# Patient Record
Sex: Female | Born: 1980
Health system: Southern US, Community
[De-identification: ages and names within clinical notes are randomized; demographics above are authoritative.]

## PROBLEM LIST (undated history)

## (undated) DIAGNOSIS — A599 Trichomoniasis, unspecified: Secondary | ICD-10-CM

## (undated) DIAGNOSIS — R0600 Dyspnea, unspecified: Secondary | ICD-10-CM

## (undated) DIAGNOSIS — J40 Bronchitis, not specified as acute or chronic: Secondary | ICD-10-CM

## (undated) DIAGNOSIS — G4700 Insomnia, unspecified: Secondary | ICD-10-CM

## (undated) DIAGNOSIS — F419 Anxiety disorder, unspecified: Secondary | ICD-10-CM

## (undated) DIAGNOSIS — J45909 Unspecified asthma, uncomplicated: Secondary | ICD-10-CM

## (undated) DIAGNOSIS — K219 Gastro-esophageal reflux disease without esophagitis: Secondary | ICD-10-CM

## (undated) HISTORY — DX: Insomnia, unspecified: G47.00

## (undated) HISTORY — PX: OTHER SURGICAL HISTORY: SHX169

## (undated) HISTORY — PX: ABDOMINAL HYSTERECTOMY: SHX81

## (undated) HISTORY — DX: Trichomoniasis, unspecified: A59.9

## (undated) HISTORY — PX: TUBAL LIGATION: SHX77

---

## 2002-04-04 ENCOUNTER — Emergency Department (HOSPITAL_COMMUNITY): Admission: EM | Admit: 2002-04-04 | Discharge: 2002-04-04 | Payer: Self-pay | Admitting: Emergency Medicine

## 2002-07-10 ENCOUNTER — Inpatient Hospital Stay (HOSPITAL_COMMUNITY): Admission: AD | Admit: 2002-07-10 | Discharge: 2002-07-12 | Payer: Self-pay | Admitting: Obstetrics and Gynecology

## 2002-08-10 ENCOUNTER — Ambulatory Visit (HOSPITAL_COMMUNITY): Admission: RE | Admit: 2002-08-10 | Discharge: 2002-08-10 | Payer: Self-pay | Admitting: Obstetrics and Gynecology

## 2003-08-21 ENCOUNTER — Emergency Department (HOSPITAL_COMMUNITY): Admission: EM | Admit: 2003-08-21 | Discharge: 2003-08-21 | Payer: Self-pay | Admitting: Emergency Medicine

## 2003-08-27 ENCOUNTER — Encounter (HOSPITAL_COMMUNITY): Admission: RE | Admit: 2003-08-27 | Discharge: 2003-09-26 | Payer: Self-pay | Admitting: Preventative Medicine

## 2004-06-18 ENCOUNTER — Emergency Department (HOSPITAL_COMMUNITY): Admission: EM | Admit: 2004-06-18 | Discharge: 2004-06-18 | Payer: Self-pay | Admitting: Emergency Medicine

## 2004-12-25 ENCOUNTER — Emergency Department (HOSPITAL_COMMUNITY): Admission: EM | Admit: 2004-12-25 | Discharge: 2004-12-25 | Payer: Self-pay | Admitting: Emergency Medicine

## 2006-07-04 ENCOUNTER — Emergency Department (HOSPITAL_COMMUNITY): Admission: EM | Admit: 2006-07-04 | Discharge: 2006-07-04 | Payer: Self-pay | Admitting: Emergency Medicine

## 2006-11-03 ENCOUNTER — Emergency Department (HOSPITAL_COMMUNITY): Admission: EM | Admit: 2006-11-03 | Discharge: 2006-11-03 | Payer: Self-pay | Admitting: Emergency Medicine

## 2007-02-05 ENCOUNTER — Emergency Department (HOSPITAL_COMMUNITY): Admission: EM | Admit: 2007-02-05 | Discharge: 2007-02-05 | Payer: Self-pay | Admitting: *Deleted

## 2007-03-03 ENCOUNTER — Ambulatory Visit (HOSPITAL_COMMUNITY): Admission: RE | Admit: 2007-03-03 | Discharge: 2007-03-03 | Payer: Self-pay | Admitting: Unknown Physician Specialty

## 2007-09-07 ENCOUNTER — Emergency Department (HOSPITAL_COMMUNITY): Admission: EM | Admit: 2007-09-07 | Discharge: 2007-09-07 | Payer: Self-pay | Admitting: Emergency Medicine

## 2007-10-12 ENCOUNTER — Ambulatory Visit: Payer: Self-pay | Admitting: Internal Medicine

## 2007-10-17 ENCOUNTER — Ambulatory Visit (HOSPITAL_COMMUNITY): Admission: RE | Admit: 2007-10-17 | Discharge: 2007-10-17 | Payer: Self-pay | Admitting: Internal Medicine

## 2007-10-17 ENCOUNTER — Encounter: Payer: Self-pay | Admitting: Internal Medicine

## 2007-10-17 ENCOUNTER — Ambulatory Visit: Payer: Self-pay | Admitting: Internal Medicine

## 2009-10-20 ENCOUNTER — Emergency Department (HOSPITAL_COMMUNITY): Admission: EM | Admit: 2009-10-20 | Discharge: 2009-10-20 | Payer: Self-pay | Admitting: Emergency Medicine

## 2010-10-26 LAB — CBC
HCT: 38.5 % (ref 36.0–46.0)
Hemoglobin: 13 g/dL (ref 12.0–15.0)
MCHC: 33.8 g/dL (ref 30.0–36.0)
MCV: 81.7 fL (ref 78.0–100.0)
Platelets: 281 10*3/uL (ref 150–400)
RBC: 4.71 MIL/uL (ref 3.87–5.11)
RDW: 13.8 % (ref 11.5–15.5)
WBC: 8.1 10*3/uL (ref 4.0–10.5)

## 2010-10-26 LAB — BASIC METABOLIC PANEL
BUN: 15 mg/dL (ref 6–23)
CO2: 24 mEq/L (ref 19–32)
Calcium: 8.9 mg/dL (ref 8.4–10.5)
Chloride: 107 mEq/L (ref 96–112)
Creatinine, Ser: 0.71 mg/dL (ref 0.4–1.2)
GFR calc Af Amer: >60 mL/min (ref >60)
GFR calc non Af Amer: >60 mL/min (ref >60)
Glucose, Bld: 101 mg/dL — ABNORMAL HIGH (ref 70–99)
Potassium: 4.1 mEq/L (ref 3.5–5.1)
Sodium: 138 mEq/L (ref 135–145)

## 2010-10-26 LAB — DIFFERENTIAL
Basophils Absolute: 0 10*3/uL (ref 0.0–0.1)
Basophils Relative: 0 % (ref 0–1)
Eosinophils Absolute: 0.4 10*3/uL (ref 0.0–0.7)
Eosinophils Relative: 5 % (ref 0–5)
Lymphocytes Relative: 29 % (ref 12–46)
Lymphs Abs: 2.4 10*3/uL (ref 0.7–4.0)
Monocytes Absolute: 0.8 10*3/uL (ref 0.1–1.0)
Monocytes Relative: 10 % (ref 3–12)
Neutro Abs: 4.5 10*3/uL (ref 1.7–7.7)
Neutrophils Relative %: 56 % (ref 43–77)

## 2010-12-09 ENCOUNTER — Emergency Department (HOSPITAL_COMMUNITY)
Admission: EM | Admit: 2010-12-09 | Discharge: 2010-12-10 | Disposition: A | Discharge status: Home / Self Care | Payer: 59 | Attending: Emergency Medicine | Admitting: Emergency Medicine

## 2010-12-09 DIAGNOSIS — R079 Chest pain, unspecified: Secondary | ICD-10-CM | POA: Insufficient documentation

## 2010-12-09 DIAGNOSIS — K219 Gastro-esophageal reflux disease without esophagitis: Secondary | ICD-10-CM | POA: Insufficient documentation

## 2010-12-16 NOTE — Consult Note (Signed)
NAME:  Rachel Jefferson, Rachel Jefferson          ACCOUNT NO.:  [id number]   MEDICAL RECORD NO.:  [id number]          PATIENT TYPE:  AMB   LOCATION:  DAY                           FACILITY:  [facility]   PHYSICIAN:  R. Roetta Sessions, M.D. DATE OF BIRTH:  Apr 24, 1981   DATE OF CONSULTATION:  DATE OF DISCHARGE:                                 CONSULTATION   REASON FOR CONSULTATION:  Rectal bleeding.   HISTORY OF PRESENT ILLNESS:  Rachel Jefferson is a very pleasant 30-  year-old [place] American female sent over through the courtesy of Dr.  Patrica Duel to further evaluate intermittent blood per rectum and  hemoccult positive stool.  Rachel Jefferson relates a couple year history  of intermittent gross blood per rectum in association with a bowel  movement.  She has a bowel movement on the order of every other day,  occasionally sees blood mixed with the stool and when she wipes. She has  not had any diarrhea.  She is occasionally constipated but goes no less  frequently than every other day.  She has some left lower quadrant  abdominal pain in association with occasional constipation.  When she is  not constipated, she has no abdominal pain.  She does have occasional  gastroesophageal reflux disease symptoms and takes Tums. No odynophagia  or dysphagia, early satiety, hematemesis.  She weighed 195 pounds back  in December 2000, has lost down to 187 as part of a desire to become  more healthy.  She does not use alcohol or tobacco.  She does take  Motrin p.r.n. for occasional headaches. There is no family history of  inflammatory bowel disease or colorectal neoplasia.   PAST MEDICAL HISTORY:  Significant for migraine headaches.   PAST SURGICAL HISTORY:  Tubal ligation.   CURRENT MEDICATIONS:  Fioricet 1 q.6h. p.r.n. headache and Motrin p.r.n.  headache.   ALLERGIES:  PENICILLIN.   FAMILY HISTORY:  Both mother and father are alive in good health,  although the father had an MI.  No history of chronic  GI or liver  illness, otherwise.   SOCIAL HISTORY:  The patient is single.  She has three children.  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  No tobacco, no alcohol, no illicit  drugs.   REVIEW OF SYSTEMS:  No chest pain.  No dyspnea on exertion.  No fever or  chills. Otherwise, as in history of present illness.   PHYSICAL EXAMINATION:  GENERAL:  A pleasant 30 year old lady resting  comfortably.  VITAL SIGNS:  Weight 187, height 5 feet 4 inches, temperature 98.5,  blood pressure 110/74, pulse 84.  SKIN:  Warm and dry.  HEENT:  No scleral icterus.  Conjunctivae are pink.  CHEST:  Lungs are clear to auscultation.  HEART:  Regular rate and rhythm without murmur, gallop, or rub.  BREASTS:  Exam is deferred.  ABDOMEN:  Obese, positive bowel sounds, soft, nontender without  reasonable mass or organomegaly.  EXTREMITIES:  No edema.  RECTUM:  Exam deferred until the time of colonoscopy.   IMPRESSION:  Rachel Jefferson is a 30 year old lady with chronic  intermittent hematochezia.  She is occasionally constipated.  She is  hemoccult positive.  I suspect a benign anorectal etiology for her  bleeding, but symptoms deserves further evaluation.   RECOMMENDATIONS:  Diagnostic colonoscopy in the very near future.  The  potential risks, benefits, alternatives, and limitations have been  reviewed.  Her questions were answered.  Will plan to perform diagnostic  colonoscopy in the very near future.   I would like to thank Dr. Patrica Duel for allowing me to see this nice  lady today.  Further recommendations to follow.      Jonathon Bellows, M.D.  Electronically Signed     RMR/MEDQ  D:  10/12/2007  T:  10/13/2007  Job:  [id number]   cc:   Patrica Duel, M.D.  Fax: [phone]

## 2010-12-16 NOTE — Op Note (Signed)
NAME:  Rachel Jefferson, Rachel Jefferson          ACCOUNT NO.:  [id number]   MEDICAL RECORD NO.:  [id number]          PATIENT TYPE:  AMB   LOCATION:  DAY                           FACILITY:  [facility]   PHYSICIAN:  R. Roetta Sessions, M.D. DATE OF BIRTH:  01-07-1981   DATE OF PROCEDURE:  10/17/2007  DATE OF DISCHARGE:                               OPERATIVE REPORT   PROCEDURE:  Ileocolonoscopy with biopsy.   INDICATIONS FOR PROCEDURE:  A 30 year old lady with painless low-volume  hematochezia.  Colonoscopy is now being done.  Risks, benefits,  alternatives and limitations have been reviewed previously and again at  the bedside, questions answered.  She is agreeable.  Please see  documentation on the medical record.   PROCEDURE NOTE:  O2 saturation, blood pressure, pulse and respirations  were monitored throughout the entire procedure.   CONSCIOUS SEDATION:  Versed 5 mg IV, Demerol 75 mg IV in divided doses.   INSTRUMENT:  Pentax video chip system.   FINDINGS:  Digital rectal exam revealed no abnormalities.   FINDINGS:  Prep was good.   Colon:  Colonic mucosa was surveyed from the rectosigmoid junction  through the left, transverse, right colon to the area of the appendiceal  orifice, ileocecal valve and cecum.  These structures were well seen and  photographed for the record.  Terminal ileum was intubated to 10 cm.  From this level, the scope was slowly withdrawn.  All previously  mentioned mucosal surfaces were again seen.  The patient had scattered  pancolonic diverticula all way to the cecum.  There was a diminutive mid  sigmoid polyp about 3 mm in dimension which was cold biopsied and  removed.  The remainder of the colonic mucosa and terminal ileal mucosa  appeared normal.  Scope was pulled in down the rectum where thorough  examination of the rectal mucosa had en face view of the rectum and anal  canal, revealed no abnormalities.  The patient tolerated the procedure  well as reactive to  endoscopy.   IMPRESSION:  Normal rectum, pancolonic diverticula, diminutive mid  sigmoid polyp status post cold biopsy removal.  Remainder of colonic  mucosa and terminal ileal mucosa appeared normal.   I suspect the patient has had benign anorectal bleeding in the setting  of occasional constipation.   RECOMMENDATIONS:  1. Diverticulosis polyp literature provided to Ms. Thamas Jaegers.  Daily      fiber supplement with Benefiber one      tablespoon daily, a 10-day course of Anusol HC suppositories one      per rectum bedtime.  2. Follow-up on path.  3. Further recommendations to follow.      Jonathon Bellows, M.D.  Electronically Signed     RMR/MEDQ  D:  10/17/2007  T:  10/17/2007  Job:  [id number]

## 2010-12-19 NOTE — H&P (Signed)
   NAME:  Rachel Jefferson, HABERLE NO.:  [id number]   MEDICAL RECORD NO.:  [id number]                  PATIENT TYPE:   LOCATION:                                       FACILITY:   PHYSICIAN:  Tilda Burrow, M.D.              DATE OF BIRTH:   DATE OF ADMISSION:  DATE OF DISCHARGE:                                HISTORY & PHYSICAL   ADMISSION DIAGNOSIS:  Desire for elective permanent sterilization.   HISTORY OF PRESENT ILLNESS:  This 30 year old female, gravida 5, para 3, AB  2 was recently status post vaginal delivery.  She is admitted for elective  permanent sterilization.  She has not been sexually active since delivery  and has reaffirmed her desire for permanent sterilization.   PAST MEDICAL HISTORY:  Benign.   PAST SURGICAL HISTORY:  Negative.   ALLERGIES:  None known.   SOCIAL HISTORY:  Single, long-term relationship.  No future child bearing  plans under any circumstance.   The patient is aware of the failure rate of 1:100 for any tubal method.  Falope ring technique reviewed with patient.   PHYSICAL EXAMINATION:  VITAL SIGNS:  Height 5 feet 8 inches.  Weight 166.  Blood pressure 120/60, pulse 76.  GENERAL:  The patient is a healthy, Caucasian female, alert and oriented x3.  HEENT:  Pupils are equal, round, and reactive to light.  Extraocular  movements are intact.  NECK:  Supple.  Trachea midline.  CHEST:  Clear to auscultation.  ABDOMEN:  nontender.  PELVIC:  External genitalia well healed. Vaginal exam: Normal secretions.  Cervix normal.  Pap smear 6 months ago normal.  Uterus anterior, normal  size, shape and contour.  Adnexa negative for masses.   ASSESSMENT:  Desire for elective sterilization.   PLAN:  Laparoscopic tubal sterilization, Falope rings on 08/10/02.                                               Tilda Burrow, M.D.    JVF/MEDQ  D:  08/09/2002  T:  08/09/2002  Job:  [id number]

## 2010-12-19 NOTE — Op Note (Signed)
NAME:  Rachel Jefferson, YORE NO.:  [id number]   MEDICAL RECORD NO.:  [id number]                   PATIENT TYPE:  AMB   LOCATION:  DAY                                  FACILITY:  [facility]   PHYSICIAN:  Tilda Burrow, M.D.              DATE OF BIRTH:  04-17-1981   DATE OF PROCEDURE:  08/10/2002  DATE OF DISCHARGE:                                 OPERATIVE REPORT   PREOPERATIVE DIAGNOSIS:  Elective sterilization.   POSTOPERATIVE DIAGNOSIS:  Elective sterilization.   PROCEDURE:  Laparoscopic tubal sterilization, Falope ring.   SURGEON:  Tilda Burrow, M.D.   ANESTHESIA:  General, Tracy C.R.N.A.   COMPLICATIONS:  None.   FINDINGS:  Mobile uterus, 500 cubic centimeters urine in bladder.  Normal  uterus, tubes, and ovaries bilaterally.  No pelvic adhesions noted.   INDICATIONS:  A 30 year old female, gravida 5, para 3, AB 2, desiring  permanent sterilization.   DETAILS OF PROCEDURE:  The patient was taken to the operating room, prepped  and  draped for a combined abdominal and vaginal procedure, with Hulka  tenaculum attached to the cervix for uterine  manipulation. Bladder in-and-  out catheterization. An infraumbilical, 1 cm vertical incision, as well as a  transverse suprapubic 1 cm incision. Veress needle was used to introduce  pneumoperitoneum through the umbilical incision with the pneumoperitoneum  easily introduced under 10 mmHg of pressure. Introduction of the Veress  needle was done, carefully elevating the abdominal wall and orienting the  needle toward the pelvis.   The laparoscopic trocar was then carefully introduced into the abdomen using  a similar technique, and the laparoscope was used to visualize normal pelvic  anatomy with no evidence of bleeding or trauma. The suprapubic trocar was  introduced under direct visualization, and then attention was directed to  the left fallopian tube, which was identified up to its fimbriated end,  elevated and a mid-segment loop of the tube was drawn up into the Falope  ring applier, Marcaine 0.25% applied to the surface of the tube and the  Falope ring applied, inspected, and found to be in satisfactory position.  The opposite tube was then treated in a similar fashion. The mesosalpinx  beneath the Falope ring on each side was then infiltrated with approximately  3 cc of Marcaine 0.25%, using a transabdominal approach with a 22-gauge  spinal needle. Then the laparoscopic equipment was removed after instilling  200 cc of saline into the abdomen and deflating the abdomen. Subcuticular 4-  0 Dexon was used to close the skin and Steri-Strips were placed on the skin  surface. Sponge and needle counts were correct. The patient tolerated the  procedure well, was awakened, and went to the recovery room in good  condition.    ADDENDUM:  Note:  The patient has multiple pigmented lesions over shins,  inguinal area, and axillae.  Please question the patient as to type of  lesions  present at follow-up visit.                                                Tilda Burrow, M.D.    JVF/MEDQ  D:  08/10/2002  T:  08/10/2002  Job:  [id number]

## 2010-12-19 NOTE — H&P (Signed)
   NAME:  NYHLA, MOUNTJOY NO.:  [id number]   MEDICAL RECORD NO.:  [id number]                   PATIENT TYPE:  INP   LOCATION:  A428                                 FACILITY:  [facility]   PHYSICIAN:  Tilda Burrow, M.D.              DATE OF BIRTH:  [birth year]   DATE OF ADMISSION:  07/10/2002  DATE OF DISCHARGE:                                HISTORY & PHYSICAL   REASON FOR ADMISSION:  Pregnancy at 36 weeks and 6 days in early labor.   HISTORY OF PRESENT ILLNESS:  The patient has been having contractions since  12:30 this afternoon.  She has become more uncomfortable and came to the  office for a work-in where her cervix is 4 cm, 70% effaced, -1 station.   PAST MEDICAL HISTORY:  Negative.   PAST SURGICAL HISTORY:  Negative.   ALLERGIES:  She has no known drug allergies.   MEDICATIONS:  She is on prenatal vitamins.   SOCIAL HISTORY:  She is single.  Contraceptive:  She is considering tubal  ligation.   PRENATAL COURSE:  Has essentially been uneventful.  She had a false positive  HIV which was a negative Western block x2.  Blood type is B+.  Rubella is  immune.  Hepatitis surface antigen negative.  HIV is negative per Western  block.  Serology is nonreactive.  GC and Chlamydia are both negative.  Pap  is normal done on May 2003.  A 28 week hemoglobin was 9.9, 28 week  hematocrit 30.9, one hour glucose 110.  Tuberculosis screen was negative.  GBS was negative.   PHYSICAL EXAMINATION:  VITAL SIGNS:  Weight 175, blood pressure 110/72.  She  has 2+ leukocytes, 1+ ketones in her urine due to she has not had a whole  lot to eat or drink today due to the discomfort she has been having.  PELVIC:  Cervix is 4 cm, 70% effaced, -1 station.   PROCEDURE:  An amniotomy was performed in the office at 3 p.m. which was a  scant amount of clear fluid was noted.  The patient tolerated procedure  well.  Fetal heart was 140s-150s upon leaving the office to go to the  hospital.   PLAN:  We are going to admit.  Pitocin augmentation for her labor.  Expect  vaginal delivery.    Zerita Boers, Reita Cliche, M.D.   DL/MEDQ  D:  16/05/9603  T:  07/10/2002  Job:  [id number]   cc:   [facility]

## 2010-12-19 NOTE — Op Note (Signed)
   NAME:  Rachel Jefferson, Rachel Jefferson NO.:  [id number]   MEDICAL RECORD NO.:  [id number]                   PATIENT TYPE:  INP   LOCATION:  A428                                 FACILITY:  [facility]   PHYSICIAN:  Tilda Burrow, M.D.              DATE OF BIRTH:  1980/12/20   DATE OF PROCEDURE:  07/10/2002  DATE OF DISCHARGE:                                  PROCEDURE NOTE   ONSET OF LABOR:  July 10, 2002.   DATE OF DELIVERY:  July 10, 2002, at 1758 hours.   LENGTH OF FIRST STAGE OF LABOR:  Two hours and 53 minutes.   LENGTH OF SECOND STAGE OF LABOR:  Five minutes.   LENGTH OF THIRD STAGE OF LABOR:  Seven minutes.   DELIVERY NOTE:  The patient had a normal spontaneous delivery of a viable  female infant weighing 5 pounds 12 ounces upon delivery of head.  The  shoulders delivered spontaneously without any difficulty after rotation.  Upon delivery, the infant was suctioned and dry.  He was alert, active, and  vigorous with a strong cry and good movement of all extremities.  The cord  was clamped and cut.  The infant was dried and placed in the infant warmer  due to the mother's sedated state.  Upon inspection, the perineum was noted  to be intact.  The third stage of labor was actively managed with 20 units  of Pitocin and 1000 cc of D5 LR at a rapid rate.  The placenta delivered  spontaneously via Tomasa Blase mechanism with membranes intact upon inspection.  A three-vessel cord was noted upon inspection.     Zerita Boers, Reita Cliche, M.D.    DL/MEDQ  D:  98/06/9146  T:  07/10/2002  Job:  [id number]   cc:   [facility]

## 2011-09-10 ENCOUNTER — Other Ambulatory Visit (HOSPITAL_COMMUNITY): Payer: Self-pay | Admitting: Physician Assistant

## 2011-09-10 DIAGNOSIS — R1011 Right upper quadrant pain: Secondary | ICD-10-CM

## 2011-09-10 DIAGNOSIS — K8020 Calculus of gallbladder without cholecystitis without obstruction: Secondary | ICD-10-CM

## 2011-09-14 ENCOUNTER — Ambulatory Visit (HOSPITAL_COMMUNITY)
Admission: RE | Admit: 2011-09-14 | Discharge: 2011-09-14 | Disposition: A | Discharge status: Home / Self Care | Payer: 59 | Source: Ambulatory Visit | Attending: Physician Assistant | Admitting: Physician Assistant

## 2011-09-14 DIAGNOSIS — K8020 Calculus of gallbladder without cholecystitis without obstruction: Secondary | ICD-10-CM

## 2011-09-14 DIAGNOSIS — R1011 Right upper quadrant pain: Secondary | ICD-10-CM | POA: Insufficient documentation

## 2012-11-05 ENCOUNTER — Emergency Department (HOSPITAL_COMMUNITY)
Admission: EM | Admit: 2012-11-05 | Discharge: 2012-11-05 | Disposition: A | Discharge status: Home / Self Care | Payer: 59 | Attending: Emergency Medicine | Admitting: Emergency Medicine

## 2012-11-05 ENCOUNTER — Encounter (HOSPITAL_COMMUNITY): Payer: Self-pay | Admitting: *Deleted

## 2012-11-05 DIAGNOSIS — Z3202 Encounter for pregnancy test, result negative: Secondary | ICD-10-CM | POA: Insufficient documentation

## 2012-11-05 DIAGNOSIS — Z9851 Tubal ligation status: Secondary | ICD-10-CM | POA: Insufficient documentation

## 2012-11-05 DIAGNOSIS — N949 Unspecified condition associated with female genital organs and menstrual cycle: Secondary | ICD-10-CM | POA: Insufficient documentation

## 2012-11-05 DIAGNOSIS — R102 Pelvic and perineal pain: Secondary | ICD-10-CM

## 2012-11-05 DIAGNOSIS — N938 Other specified abnormal uterine and vaginal bleeding: Secondary | ICD-10-CM | POA: Insufficient documentation

## 2012-11-05 LAB — CBC WITH DIFFERENTIAL/PLATELET
HCT: 39.4 % (ref 36.0–46.0)
Hemoglobin: 13.2 g/dL (ref 12.0–15.0)
Lymphocytes Relative: 46 % (ref 12–46)
Lymphs Abs: 2.8 10*3/uL (ref 0.7–4.0)
MCH: 27.3 pg (ref 26.0–34.0)
MCHC: 33.5 g/dL (ref 30.0–36.0)
MCV: 81.4 fL (ref 78.0–100.0)
Monocytes Absolute: 0.5 10*3/uL (ref 0.1–1.0)
Monocytes Relative: 8 % (ref 3–12)
Neutro Abs: 2.4 10*3/uL (ref 1.7–7.7)
Neutrophils Relative %: 39 % — ABNORMAL LOW (ref 43–77)
Platelets: 272 10*3/uL (ref 150–400)
RBC: 4.84 MIL/uL (ref 3.87–5.11)
WBC: 6 10*3/uL (ref 4.0–10.5)

## 2012-11-05 LAB — URINALYSIS, ROUTINE W REFLEX MICROSCOPIC
Bilirubin Urine: NEGATIVE
Glucose, UA: NEGATIVE mg/dL
Ketones, ur: NEGATIVE mg/dL
Leukocytes, UA: NEGATIVE
Nitrite: NEGATIVE
Protein, ur: NEGATIVE mg/dL
Specific Gravity, Urine: <1.005 — ABNORMAL LOW (ref 1.005–1.030)
Urobilinogen, UA: 0.2 mg/dL (ref 0.0–1.0)
pH: 6 (ref 5.0–8.0)

## 2012-11-05 LAB — COMPREHENSIVE METABOLIC PANEL
ALT: 12 U/L (ref 0–35)
Albumin: 3.3 g/dL — ABNORMAL LOW (ref 3.5–5.2)
Alkaline Phosphatase: 96 U/L (ref 39–117)
BUN: 9 mg/dL (ref 6–23)
CO2: 22 mEq/L (ref 19–32)
Calcium: 8.8 mg/dL (ref 8.4–10.5)
Chloride: 104 mEq/L (ref 96–112)
Creatinine, Ser: 0.64 mg/dL (ref 0.50–1.10)
GFR calc non Af Amer: >90 mL/min (ref >90)
Glucose, Bld: 102 mg/dL — ABNORMAL HIGH (ref 70–99)
Potassium: 3.9 mEq/L (ref 3.5–5.1)
Total Bilirubin: 0.3 mg/dL (ref 0.3–1.2)

## 2012-11-05 LAB — WET PREP, GENITAL
Clue Cells Wet Prep HPF POC: NONE SEEN
Trich, Wet Prep: NONE SEEN
WBC, Wet Prep HPF POC: NONE SEEN
Yeast Wet Prep HPF POC: NONE SEEN

## 2012-11-05 LAB — POCT PREGNANCY, URINE: Preg Test, Ur: NEGATIVE

## 2012-11-05 MED ORDER — MEDROXYPROGESTERONE ACETATE 5 MG PO TABS: 5.0000 mg | ORAL_TABLET | Freq: Every day | ORAL | Status: DC

## 2012-11-05 MED ORDER — AZITHROMYCIN 250 MG PO TABS
1000.0000 mg | ORAL_TABLET | Freq: Once | ORAL | Status: AC
  Administered 2012-11-05: 1000 mg via ORAL
  Filled 2012-11-05: qty 4

## 2012-11-05 MED ORDER — SODIUM CHLORIDE 0.9 % IV SOLN
INTRAVENOUS | Status: DC
  Administered 2012-11-05: 08:00:00 via INTRAVENOUS

## 2012-11-05 MED ORDER — HYDROMORPHONE HCL PF 1 MG/ML IJ SOLN
1.0000 mg | Freq: Once | INTRAMUSCULAR | Status: AC
  Administered 2012-11-05: 1 mg via INTRAVENOUS
  Filled 2012-11-05: qty 1

## 2012-11-05 MED ORDER — ONDANSETRON HCL 4 MG/2ML IJ SOLN
4.0000 mg | Freq: Once | INTRAMUSCULAR | Status: AC
  Administered 2012-11-05: 4 mg via INTRAVENOUS
  Filled 2012-11-05: qty 2

## 2012-11-05 MED ORDER — LIDOCAINE-EPINEPHRINE (PF) 1 %-1:200000 IJ SOLN
INTRAMUSCULAR | Status: AC
  Filled 2012-11-05: qty 10

## 2012-11-05 MED ORDER — CEFTRIAXONE SODIUM 250 MG IJ SOLR
250.0000 mg | Freq: Once | INTRAMUSCULAR | Status: AC
  Administered 2012-11-05: 250 mg via INTRAMUSCULAR
  Filled 2012-11-05: qty 250

## 2012-11-05 MED ORDER — OXYCODONE-ACETAMINOPHEN 5-325 MG PO TABS: 1.0000 | ORAL_TABLET | ORAL | Status: DC | PRN

## 2012-11-05 NOTE — ED Notes (Signed)
Patient with no complaints at this time. Respirations even and unlabored. Skin warm/dry. Discharge instructions reviewed with patient at this time. Patient given opportunity to voice concerns/ask questions. IV removed per policy and band-aid applied to site. Patient discharged at this time and left Emergency Department with steady gait.  

## 2012-11-05 NOTE — ED Provider Notes (Addendum)
History    This chart was scribed for Carleene Cooper III, MD by Charolett Bumpers, ED Scribe. The patient was seen in room APA08/APA08. Patient's care was started at 07:04.   CSN: [id number]  Arrival date & time 11/05/12  4696   First MD Initiated Contact with Patient 11/05/12 [phone]      Chief Complaint  Patient presents with  . Abdominal Pain    The history is provided by the patient. No language interpreter was used.   Rachel Jefferson is a 32 y.o. female who presents to the Emergency Department complaining of severe, crampy suprapubic abdominal pain that started last night around 7:30 pm. She reports she is passing clots with her menstrual cycle currently and the abdominal pain is unusual for her menses. She has taken Midol without relief. She denies any nausea, vomiting, diarrhea, difficulty urinating, fevers, ear pain, sore throat, cough, chest pain, rash, syncope, seizures. She states that she is otherwise normal healthy and denies any regular medications. She has a h/o tubal ligation and has been treated for GERD in the past and has had gallstones visualized by ultrasound. She denies tobacco or alcohol use.   History reviewed. No pertinent past medical history.  Past Surgical History  Procedure Laterality Date  . Tubes tied      No family history on file.  History  Substance Use Topics  . Smoking status: Never Smoker   . Smokeless tobacco: Not on file  . Alcohol Use: No    OB History   Grav Para Term Preterm Abortions TAB SAB Ect Mult Living                  Review of Systems  Constitutional: Negative for fever.  HENT: Negative for ear pain and sore throat.   Respiratory: Negative for cough.   Cardiovascular: Negative for chest pain.  Gastrointestinal: Positive for abdominal pain. Negative for nausea, vomiting and diarrhea.  Genitourinary: Positive for menstrual problem. Negative for difficulty urinating.  Skin: Negative for rash.  Neurological: Negative  for seizures and syncope.  All other systems reviewed and are negative.    Allergies  Penicillins  Home Medications  No current outpatient prescriptions on file.  BP 188/89  Pulse 79  Temp(Src) 98.4 F (36.9 C) (Oral)  Resp 18  Ht 5\' 4"  (1.626 m)  Wt 200 lb (90.719 kg)  BMI 34.31 kg/m2  SpO2 98%  LMP 11/05/2012  Physical Exam  Nursing note and vitals reviewed. Constitutional: She is oriented to person, place, and time. She appears well-developed and well-nourished. No distress.  HENT:  Head: Normocephalic and atraumatic.  Right Ear: Tympanic membrane, external ear and ear canal normal.  Left Ear: Tympanic membrane, external ear and ear canal normal.  Nose: Nose normal.  Mouth/Throat: Oropharynx is clear and moist. No oropharyngeal exudate.  Eyes: Conjunctivae and EOM are normal. Pupils are equal, round, and reactive to light.  Neck: Normal range of motion. Neck supple. No tracheal deviation present.  Cardiovascular: Normal rate, regular rhythm and normal heart sounds.   Pulmonary/Chest: Effort normal and breath sounds normal. No respiratory distress.  Abdominal: Soft. Bowel sounds are normal. She exhibits no distension and no mass. There is no tenderness. There is no rebound and no guarding.  Pain is localized in the suprapubic region, but no masses or tenderness  Genitourinary:  On speculum exam, dark red blood noted with a slightly purulent appearance. On manual exam, mild tenderness over the uterus but no adnexal tenderness or  masses.   Musculoskeletal: Normal range of motion. She exhibits no edema.  Neurological: She is alert and oriented to person, place, and time.  Skin: Skin is warm and dry.  Psychiatric: She has a normal mood and affect. Her behavior is normal.    ED Course  Procedures (including critical care time)  DIAGNOSTIC STUDIES: Oxygen Saturation is 98% on room air, normal by my interpretation.    COORDINATION OF CARE:  7:20 AM-Discussed planned  course of treatment with the patient including IV pain and nausea medication and a pelvic exam, who is agreeable at this time.   7:30 AM-Medication Orders: 0.9% sodium chloride infusion-continuous; Hydromorphone (Dilaudid) injection 1 mg-once; Ondansetron (Zofran) injection 4 mg-once.   8:00 AM-Pelvic exam preformed with chaperon present.   9:24 AM-Informed pt of lab results. Will start pt on antibiotics empirically, Provera and pain medication. She is followed by Robbie Lis.   Results for orders placed during the hospital encounter of 11/05/12  WET PREP, GENITAL      Result Value Range   Yeast Wet Prep HPF POC NONE SEEN  NONE SEEN   Trich, Wet Prep NONE SEEN  NONE SEEN   Clue Cells Wet Prep HPF POC NONE SEEN  NONE SEEN   WBC, Wet Prep HPF POC NONE SEEN  NONE SEEN  CBC WITH DIFFERENTIAL      Result Value Range   WBC 6.0  4.0 - 10.5 K/uL   RBC 4.84  3.87 - 5.11 MIL/uL   Hemoglobin 13.2  12.0 - 15.0 g/dL   HCT 16.1  09.6 - 04.5 %   MCV 81.4  78.0 - 100.0 fL   MCH 27.3  26.0 - 34.0 pg   MCHC 33.5  30.0 - 36.0 g/dL   RDW 40.9  81.1 - 91.4 %   Platelets 272  150 - 400 K/uL   Neutrophils Relative 39 (*) 43 - 77 %   Neutro Abs 2.4  1.7 - 7.7 K/uL   Lymphocytes Relative 46  12 - 46 %   Lymphs Abs 2.8  0.7 - 4.0 K/uL   Monocytes Relative 8  3 - 12 %   Monocytes Absolute 0.5  0.1 - 1.0 K/uL   Eosinophils Relative 6 (*) 0 - 5 %   Eosinophils Absolute 0.4  0.0 - 0.7 K/uL   Basophils Relative 1  0 - 1 %   Basophils Absolute 0.0  0.0 - 0.1 K/uL  COMPREHENSIVE METABOLIC PANEL      Result Value Range   Sodium 136  135 - 145 mEq/L   Potassium 3.9  3.5 - 5.1 mEq/L   Chloride 104  96 - 112 mEq/L   CO2 22  19 - 32 mEq/L   Glucose, Bld 102 (*) 70 - 99 mg/dL   BUN 9  6 - 23 mg/dL   Creatinine, Ser 7.82  0.50 - 1.10 mg/dL   Calcium 8.8  8.4 - 95.6 mg/dL   Total Protein 7.3  6.0 - 8.3 g/dL   Albumin 3.3 (*) 3.5 - 5.2 g/dL   AST 17  0 - 37 U/L   ALT 12  0 - 35 U/L   Alkaline Phosphatase 96  39  - 117 U/L   Total Bilirubin 0.3  0.3 - 1.2 mg/dL   GFR calc non Af Amer >90  >90 mL/min   GFR calc Af Amer >90  >90 mL/min  URINALYSIS, ROUTINE W REFLEX MICROSCOPIC      Result Value Range   Color, Urine  YELLOW  YELLOW   APPearance CLEAR  CLEAR   Specific Gravity, Urine <1.005 (*) 1.005 - 1.030   pH 6.0  5.0 - 8.0   Glucose, UA NEGATIVE  NEGATIVE mg/dL   Hgb urine dipstick TRACE (*) NEGATIVE   Bilirubin Urine NEGATIVE  NEGATIVE   Ketones, ur NEGATIVE  NEGATIVE mg/dL   Protein, ur NEGATIVE  NEGATIVE mg/dL   Urobilinogen, UA 0.2  0.0 - 1.0 mg/dL   Nitrite NEGATIVE  NEGATIVE   Leukocytes, UA NEGATIVE  NEGATIVE  URINE MICROSCOPIC-ADD ON      Result Value Range   Squamous Epithelial / LPF FEW (*) RARE   RBC / HPF 0-2  <3 RBC/hpf  POCT PREGNANCY, URINE      Result Value Range   Preg Test, Ur NEGATIVE  NEGATIVE    Lab results reassuringly negative.  Will treat for pelvic pain with Rocephin and Zithromax, and for dysfunctional uterine bleeding with Provera, and for her pain with Percocet.   1. Pelvic pain   2. Dysfunctional uterine bleeding    I personally performed the services described in this documentation, which was scribed in my presence. The recorded information has been reviewed and is accurate.  Rachel Jefferson, M.D.    Carleene Cooper III, MD 11/05/12 0930      Carleene Cooper III, MD 11/05/12 [phone]

## 2012-11-05 NOTE — ED Notes (Addendum)
Pt c/o abdominal pain and cramping. Pt is on her menstrual cycle. States she is passing clots.

## 2012-11-06 LAB — GC/CHLAMYDIA PROBE AMP
CT Probe RNA: NEGATIVE
GC Probe RNA: NEGATIVE

## 2012-11-07 ENCOUNTER — Encounter (HOSPITAL_COMMUNITY): Payer: Self-pay | Admitting: Emergency Medicine

## 2012-11-07 ENCOUNTER — Emergency Department (HOSPITAL_COMMUNITY): Payer: 59

## 2012-11-07 ENCOUNTER — Emergency Department (HOSPITAL_COMMUNITY)
Admission: EM | Admit: 2012-11-07 | Discharge: 2012-11-07 | Disposition: A | Discharge status: Home / Self Care | Payer: 59 | Attending: Emergency Medicine | Admitting: Emergency Medicine

## 2012-11-07 DIAGNOSIS — Z8742 Personal history of other diseases of the female genital tract: Secondary | ICD-10-CM | POA: Insufficient documentation

## 2012-11-07 DIAGNOSIS — R10816 Epigastric abdominal tenderness: Secondary | ICD-10-CM | POA: Insufficient documentation

## 2012-11-07 DIAGNOSIS — M545 Low back pain, unspecified: Secondary | ICD-10-CM | POA: Insufficient documentation

## 2012-11-07 DIAGNOSIS — R109 Unspecified abdominal pain: Secondary | ICD-10-CM

## 2012-11-07 DIAGNOSIS — Z9851 Tubal ligation status: Secondary | ICD-10-CM | POA: Insufficient documentation

## 2012-11-07 DIAGNOSIS — Z3202 Encounter for pregnancy test, result negative: Secondary | ICD-10-CM | POA: Insufficient documentation

## 2012-11-07 DIAGNOSIS — Z79899 Other long term (current) drug therapy: Secondary | ICD-10-CM | POA: Insufficient documentation

## 2012-11-07 LAB — COMPREHENSIVE METABOLIC PANEL
ALT: 11 U/L (ref 0–35)
CO2: 26 mEq/L (ref 19–32)
Calcium: 8.9 mg/dL (ref 8.4–10.5)
Creatinine, Ser: 0.74 mg/dL (ref 0.50–1.10)
GFR calc Af Amer: >90 mL/min (ref >90)
GFR calc non Af Amer: >90 mL/min (ref >90)
Glucose, Bld: 96 mg/dL (ref 70–99)

## 2012-11-07 LAB — URINE MICROSCOPIC-ADD ON

## 2012-11-07 LAB — URINALYSIS, ROUTINE W REFLEX MICROSCOPIC
Bilirubin Urine: NEGATIVE
Nitrite: NEGATIVE
Protein, ur: NEGATIVE mg/dL
Specific Gravity, Urine: 1.006 (ref 1.005–1.030)
Urobilinogen, UA: 0.2 mg/dL (ref 0.0–1.0)

## 2012-11-07 LAB — LACTIC ACID, PLASMA: Lactic Acid, Venous: 0.8 mmol/L (ref 0.5–2.2)

## 2012-11-07 MED ORDER — HYDROMORPHONE HCL PF 1 MG/ML IJ SOLN
1.0000 mg | Freq: Once | INTRAMUSCULAR | Status: AC
  Administered 2012-11-07: 1 mg via INTRAVENOUS
  Filled 2012-11-07: qty 1

## 2012-11-07 MED ORDER — IOHEXOL 300 MG/ML  SOLN: 50.0000 mL | Freq: Once | INTRAMUSCULAR | Status: DC | PRN

## 2012-11-07 MED ORDER — ONDANSETRON HCL 4 MG/2ML IJ SOLN
4.0000 mg | Freq: Once | INTRAMUSCULAR | Status: AC
  Administered 2012-11-07: 4 mg via INTRAVENOUS
  Filled 2012-11-07: qty 2

## 2012-11-07 MED ORDER — IOHEXOL 300 MG/ML  SOLN
100.0000 mL | Freq: Once | INTRAMUSCULAR | Status: AC | PRN
  Administered 2012-11-07: 100 mL via INTRAVENOUS

## 2012-11-07 MED ORDER — SODIUM CHLORIDE 0.9 % IV SOLN
1000.0000 mL | INTRAVENOUS | Status: DC
  Administered 2012-11-07: 1000 mL via INTRAVENOUS

## 2012-11-07 NOTE — ED Notes (Signed)
PT. REPORTS MID / LOW BACK PAIN ONSET LAST Friday WITH SLIGHT NAUSEA, DENIES EMESIS OR DIARRHEA.

## 2012-11-07 NOTE — ED Provider Notes (Signed)
History     CSN: [id number]  Arrival date & time 11/07/12  0021   First MD Initiated Contact with Patient 11/07/12 0118      Chief Complaint  Patient presents with  . Abdominal Pain  . Back Pain    (Consider location/radiation/quality/duration/timing/severity/associated sxs/prior treatment) HPI Comments: Rachel Jefferson is a 32 y.o. female who presents for evaluation of upper abdominal pain. The pain radiates to her lower back. The pain has been present for 3 days. She began her menstrual cycle 5 days ago. She was evaluated in the emergency department on 11/05/12. At that time she was diagnosed with dysmenorrhea, and placed on her Percocet and Provera. These treatments have not helped her discomfort. She denies nausea or vomiting. There has been no fever or chills, cough, or chest pain. She denies weakness or dizziness. There are no known modifying factors. She was evaluated for upper abdominal pain, and an ultrasound of the abdomen in February 2014. The ultrasound was negative.  Patient is a 32 y.o. female presenting with abdominal pain and back pain. The history is provided by the patient.  Abdominal Pain Back Pain Associated symptoms: abdominal pain     History reviewed. No pertinent past medical history.  Past Surgical History  Procedure Laterality Date  . Tubes tied    . Tubal ligation      No family history on file.  History  Substance Use Topics  . Smoking status: Never Smoker   . Smokeless tobacco: Not on file  . Alcohol Use: No    OB History   Grav Para Term Preterm Abortions TAB SAB Ect Mult Living                  Review of Systems  Gastrointestinal: Positive for abdominal pain.  Musculoskeletal: Positive for back pain.  All other systems reviewed and are negative.    Allergies  Penicillins  Home Medications   Current Outpatient Rx  Name  Route  Sig  Dispense  Refill  . Ibuprofen (MIDOL PO)   Oral   Take 1 tablet by mouth every 6 (six) hours  as needed (for pain).         Marland Kitchen oxyCODONE-acetaminophen (PERCOCET/ROXICET) 5-325 MG per tablet   Oral   Take 1 tablet by mouth every 4 (four) hours as needed for pain.         . medroxyPROGESTERone (PROVERA) 5 MG tablet   Oral   Take 5 mg by mouth daily.           BP 106/67  Pulse 58  Resp 18  SpO2 100%  LMP 11/05/2012  Physical Exam  Nursing note and vitals reviewed. Constitutional: She is oriented to person, place, and time. She appears well-developed and well-nourished.  HENT:  Head: Normocephalic and atraumatic.  Eyes: Conjunctivae and EOM are normal. Pupils are equal, round, and reactive to light.  Neck: Normal range of motion and phonation normal. Neck supple.  Cardiovascular: Normal rate, regular rhythm and intact distal pulses.   Pulmonary/Chest: Effort normal and breath sounds normal. She exhibits no tenderness.  Abdominal: Soft. She exhibits no distension and no mass. There is tenderness (Epigastric, mild). There is no rebound and no guarding.  Musculoskeletal: Normal range of motion.  Mild lumbar tenderness, bilaterally. Normal range of motion of the back.  Neurological: She is alert and oriented to person, place, and time. She has normal strength. She exhibits normal muscle tone.  Skin: Skin is warm and dry.  Psychiatric:  She has a normal mood and affect. Her behavior is normal. Judgment and thought content normal.    ED Course  Procedures (including critical care time)  Medications  0.9 %  sodium chloride infusion (0 mLs Intravenous Stopped 11/07/12 0419)  iohexol (OMNIPAQUE) 300 MG/ML solution 50 mL (not administered)  HYDROmorphone (DILAUDID) injection 1 mg (1 mg Intravenous Given 11/07/12 0223)  ondansetron (ZOFRAN) injection 4 mg (4 mg Intravenous Given 11/07/12 0223)  iohexol (OMNIPAQUE) 300 MG/ML solution 100 mL (100 mLs Intravenous Contrast Given 11/07/12 0251)     4:51 AM Reevaluation with update and discussion. After initial assessment and treatment,  an updated evaluation reveals the patient feels better.Mancel Bale L    Labs Reviewed  COMPREHENSIVE METABOLIC PANEL - Abnormal; Notable for the following:    Albumin 3.0 (*)    Total Bilirubin 0.2 (*)    All other components within normal limits  URINALYSIS, ROUTINE W REFLEX MICROSCOPIC - Abnormal; Notable for the following:    Hgb urine dipstick LARGE (*)    Leukocytes, UA SMALL (*)    All other components within normal limits  URINE MICROSCOPIC-ADD ON - Abnormal; Notable for the following:    Squamous Epithelial / LPF FEW (*)    Bacteria, UA FEW (*)    All other components within normal limits  URINE CULTURE  PREGNANCY, URINE  LACTIC ACID, PLASMA  LIPASE, BLOOD   Ct Abdomen Pelvis W Contrast  11/07/2012  *RADIOLOGY REPORT*  Clinical Data: Mid and lower back pain; mild nausea.  CT ABDOMEN AND PELVIS WITH CONTRAST  Technique:  Multidetector CT imaging of the abdomen and pelvis was performed following the standard protocol during bolus administration of intravenous contrast.  Contrast: OMNIPAQUE IOHEXOL 300 MG/ML  SOLN  Comparison: Abdominal ultrasound performed 09/14/2011, and abdominal radiograph performed earlier today at 01:53 a.m.  Findings: Minimal bibasilar atelectasis is noted.  The liver and spleen are unremarkable in appearance.  The gallbladder is within normal limits.  The pancreas and adrenal glands are unremarkable.  The kidneys are unremarkable in appearance.  There is no evidence of hydronephrosis.  No renal or ureteral stones are seen.  No perinephric stranding is appreciated.  No free fluid is identified.  The small bowel is unremarkable in appearance.  The stomach is within normal limits.  No acute vascular abnormalities are seen.  The appendix is normal in caliber and contains air, extending to the midline, without evidence for appendicitis.  Minimal diverticulosis is noted along the ascending colon.  The colon is otherwise unremarkable in appearance.  The bladder  is the mildly distended and grossly unremarkable in appearance.  The uterus is within normal limits.  The patient is status post tubal ligation, with associated clips.  The ovaries are relatively symmetric; no suspicious adnexal masses are seen.  No inguinal lymphadenopathy is seen.  No acute osseous abnormalities are identified.  IMPRESSION:  1.  No acute abnormality seen within the abdomen or pelvis. 2.  Minimal diverticulosis along the ascending colon, without evidence of diverticulitis.   Original Report Authenticated By: Tonia Ghent, M.D.    Dg Abd Acute W/chest  11/07/2012  *RADIOLOGY REPORT*  Clinical Data: Mid abdominal pain and nausea.  ACUTE ABDOMEN SERIES (ABDOMEN 2 VIEW & CHEST 1 VIEW)  Comparison: Chest radiograph performed 02/05/2007, and lumbar spine radiograph performed 11/04/2006  Findings: The lungs are well-aerated and clear.  There is no evidence of focal opacification, pleural effusion or pneumothorax. The cardiomediastinal silhouette is within normal limits.  The  visualized bowel gas pattern is unremarkable.  Scattered stool and air are seen within the colon; there is no evidence of small bowel dilatation to suggest obstruction.  No free intra-abdominal air is identified on the provided upright view.  No acute osseous abnormalities are seen; the sacroiliac joints are unremarkable in appearance.  IMPRESSION:  1.  Unremarkable bowel gas pattern; no free intra-abdominal air seen. 2.  No acute cardiopulmonary process identified.   Original Report Authenticated By: Tonia Ghent, M.D.    Nursing Notes Reviewed/ Care Coordinated, and agree without changes. Applicable Imaging Reviewed.  Interpretation of Laboratory Data incorporated into ED treatment  1. Abdominal pain       MDM  Nonspecific abdominal pain, with reassuring evaluation in the ED. The pain is possibly related to her current menses. Doubt colitis, urinary tract infection, occult infection or metabolic instability. She is  stable for discharge     Plan: Home Medications- resume current medications; Home Treatments- rest; Recommended follow up- PCP of choice, PRN      Flint Melter, MD 11/07/12 [phone]

## 2012-11-08 LAB — URINE CULTURE
Colony Count: NO GROWTH
Culture: NO GROWTH

## 2013-10-17 ENCOUNTER — Emergency Department (HOSPITAL_COMMUNITY)
Admission: EM | Admit: 2013-10-17 | Discharge: 2013-10-17 | Disposition: A | Discharge status: Home / Self Care | Payer: 59 | Attending: Emergency Medicine | Admitting: Emergency Medicine

## 2013-10-17 ENCOUNTER — Emergency Department (HOSPITAL_COMMUNITY): Payer: 59

## 2013-10-17 ENCOUNTER — Encounter (HOSPITAL_COMMUNITY): Payer: Self-pay | Admitting: Emergency Medicine

## 2013-10-17 DIAGNOSIS — J40 Bronchitis, not specified as acute or chronic: Secondary | ICD-10-CM

## 2013-10-17 DIAGNOSIS — Z79899 Other long term (current) drug therapy: Secondary | ICD-10-CM | POA: Insufficient documentation

## 2013-10-17 DIAGNOSIS — Z880 Allergy status to penicillin: Secondary | ICD-10-CM | POA: Insufficient documentation

## 2013-10-17 DIAGNOSIS — IMO0002 Reserved for concepts with insufficient information to code with codable children: Secondary | ICD-10-CM | POA: Insufficient documentation

## 2013-10-17 HISTORY — DX: Bronchitis, not specified as acute or chronic: J40

## 2013-10-17 MED ORDER — AEROCHAMBER Z-STAT PLUS/MEDIUM MISC: 1.0000 | Freq: Once | Status: DC

## 2013-10-17 MED ORDER — ALBUTEROL SULFATE HFA 108 (90 BASE) MCG/ACT IN AERS
2.0000 | INHALATION_SPRAY | Freq: Once | RESPIRATORY_TRACT | Status: AC
  Administered 2013-10-17: 2 via RESPIRATORY_TRACT
  Filled 2013-10-17: qty 6.7

## 2013-10-17 MED ORDER — PREDNISONE 10 MG PO TABS: 20.0000 mg | ORAL_TABLET | Freq: Two times a day (BID) | ORAL | Status: DC

## 2013-10-17 MED ORDER — PREDNISONE 50 MG PO TABS
60.0000 mg | ORAL_TABLET | Freq: Once | ORAL | Status: AC
  Administered 2013-10-17: 60 mg via ORAL
  Filled 2013-10-17 (×2): qty 1

## 2013-10-17 NOTE — Discharge Instructions (Signed)
Use the inhaler, 2 puffs every 3 or 4 hours for trouble breathing, or cough. Start the prednisone, prescription, tomorrow.    Bronchitis Bronchitis is inflammation of the airways that extend from the windpipe into the lungs (bronchi). The inflammation often causes mucus to develop, which leads to a cough. If the inflammation becomes severe, it may cause shortness of breath. CAUSES  Bronchitis may be caused by:   Viral infections.   Bacteria.   Cigarette smoke.   Allergens, pollutants, and other irritants.  SIGNS AND SYMPTOMS  The most common symptom of bronchitis is a frequent cough that produces mucus. Other symptoms include:  Fever.   Body aches.   Chest congestion.   Chills.   Shortness of breath.   Sore throat.  DIAGNOSIS  Bronchitis is usually diagnosed through a medical history and physical exam. Tests, such as chest X-rays, are sometimes done to rule out other conditions.  TREATMENT  You may need to avoid contact with whatever caused the problem (smoking, for example). Medicines are sometimes needed. These may include:  Antibiotics. These may be prescribed if the condition is caused by bacteria.  Cough suppressants. These may be prescribed for relief of cough symptoms.   Inhaled medicines. These may be prescribed to help open your airways and make it easier for you to breathe.   Steroid medicines. These may be prescribed for those with recurrent (chronic) bronchitis. HOME CARE INSTRUCTIONS  Get plenty of rest.   Drink enough fluids to keep your urine clear or pale yellow (unless you have a medical condition that requires fluid restriction). Increasing fluids may help thin your secretions and will prevent dehydration.   Only take over-the-counter or prescription medicines as directed by your health care provider.  Only take antibiotics as directed. Make sure you finish them even if you start to feel better.  Avoid secondhand smoke, irritating  chemicals, and strong fumes. These will make bronchitis worse. If you are a smoker, quit smoking. Consider using nicotine gum or skin patches to help control withdrawal symptoms. Quitting smoking will help your lungs heal faster.   Put a cool-mist humidifier in your bedroom at night to moisten the air. This may help loosen mucus. Change the water in the humidifier daily. You can also run the hot water in your shower and sit in the bathroom with the door closed for 5 10 minutes.   Follow up with your health care provider as directed.   Wash your hands frequently to avoid catching bronchitis again or spreading an infection to others.  SEEK MEDICAL CARE IF: Your symptoms do not improve after 1 week of treatment.  SEEK IMMEDIATE MEDICAL CARE IF:  Your fever increases.  You have chills.   You have chest pain.   You have worsening shortness of breath.   You have bloody sputum.  You faint.  You have lightheadedness.  You have a severe headache.   You vomit repeatedly. MAKE SURE YOU:   Understand these instructions.  Will watch your condition.  Will get help right away if you are not doing well or get worse. Document Released: 07/20/2005 Document Revised: 05/10/2013 Document Reviewed: 03/14/2013 [facility] Patient Information 2014 [place].

## 2013-10-17 NOTE — ED Provider Notes (Signed)
CSN: [id number]     Arrival date & time 10/17/13  1549 History   First MD Initiated Contact with Patient 10/17/13 1559     Chief Complaint  Patient presents with  . Shortness of Breath     (Consider location/radiation/quality/duration/timing/severity/associated sxs/prior Treatment) Patient is a 33 y.o. female presenting with shortness of breath. The history is provided by the patient.  Shortness of Breath  She complains of ongoing cough and shortness of breath for several months. She's been treated several times by her primary care doctor. She has received both antibiotics, and prednisone. She has not been treated with a bronchodilator. She is not a smoker. She works around Pharmac[employer]". She denies fever, chills, chest pain, nausea, vomiting, diarrhea, weakness, or dizziness. Her cough is occasionally productive of yellow sputum. There are no other known modifying factors.  Past Medical History  Diagnosis Date  . Bronchitis    Past Surgical History  Procedure Laterality Date  . Tubes tied    . Tubal ligation     No family history on file. History  Substance Use Topics  . Smoking status: Never Smoker   . Smokeless tobacco: Not on file  . Alcohol Use: No   OB History   Grav Para Term Preterm Abortions TAB SAB Ect Mult Living                 Review of Systems  Respiratory: Positive for shortness of breath.   All other systems reviewed and are negative.      Allergies  Penicillins  Home Medications   Current Outpatient Rx  Name  Route  Sig  Dispense  Refill  . cefdinir (OMNICEF) 300 MG capsule   Oral   Take 300 mg by mouth 2 (two) times daily. 10 day course. 1 day remaining in course         . oseltamivir (TAMIFLU) 75 MG capsule   Oral   Take 75 mg by mouth 2 (two) times daily.         . predniSONE (DELTASONE) 10 MG tablet   Oral   Take 2 tablets (20 mg total) by mouth 2 (two) times daily.   10 tablet   0    BP 137/87  Pulse 90  Temp(Src) 98.4 F (36.9  C) (Oral)  Resp 22  SpO2 98%  LMP 10/17/2013 Physical Exam  Nursing note and vitals reviewed. Constitutional: She is oriented to person, place, and time. She appears well-developed and well-nourished. No distress.  HENT:  Head: Normocephalic and atraumatic.  Eyes: Conjunctivae and EOM are normal. Pupils are equal, round, and reactive to light.  Neck: Normal range of motion and phonation normal. Neck supple.  Cardiovascular: Normal rate, regular rhythm and intact distal pulses.   Pulmonary/Chest: Effort normal. She exhibits no tenderness.  Decreased air movement bilaterally with scattered expiratory wheezes. No rhonchi.  Abdominal: Soft. She exhibits no distension. There is no tenderness. There is no guarding.  Musculoskeletal: Normal range of motion. She exhibits no edema and no tenderness.  Neurological: She is alert and oriented to person, place, and time. She exhibits normal muscle tone.  Skin: Skin is warm and dry.  Psychiatric: She has a normal mood and affect. Her behavior is normal. Judgment and thought content normal.    ED Course  Procedures (including critical care time)  Medications  aerochamber Z-Stat Plus/medium 1 each (not administered)  predniSONE (DELTASONE) tablet 60 mg (60 mg Oral Given 10/17/13 1707)  albuterol (PROVENTIL HFA;VENTOLIN HFA) 108 (90  BASE) MCG/ACT inhaler 2 puff (2 puffs Inhalation Given 10/17/13 1722)    Patient Vitals for the past 24 hrs:  BP Temp Temp src Pulse Resp SpO2  10/17/13 1724 - - - - - 98 %  10/17/13 1554 137/87 mmHg 98.4 F (36.9 C) Oral 90 22 98 %    5:33 PM Reevaluation with update and discussion. After initial assessment and treatment, an updated evaluation reveals she feels somewhat better. Daiel Strohecker L    Imaging Review Dg Chest 2 View  10/17/2013   CLINICAL DATA:  33 year old female with shortness of breath. History of asthma  EXAM: CHEST  2 VIEW  COMPARISON:  10/03/2013 and prior chest radiographs dating back to  02/05/2007  FINDINGS: The cardiomediastinal silhouette is unremarkable.  Mild peribronchial thickening again noted.  There is no evidence of focal airspace disease, pulmonary edema, suspicious pulmonary nodule/mass, pleural effusion, or pneumothorax. No acute bony abnormalities are identified.  IMPRESSION: No active cardiopulmonary disease.   Electronically Signed   By: Hassan Rowan M.D.   On: 10/17/2013 16:37      EKG Interpretation   Date/Time:  Tuesday October 17 2013 16:04:20 EDT Ventricular Rate:  85 PR Interval:  124 QRS Duration: 72 QT Interval:  374 QTC Calculation: 445 R Axis:   2 Text Interpretation:  Normal sinus rhythm with sinus arrhythmia Septal  infarct , age undetermined Abnormal ECG since last tracing no significant  change Confirmed by Eulis Foster  MD, Nelle Sayed [phone]) on 10/17/2013 4:13:15 PM      MDM   Final diagnoses:  Bronchitis    Evaluation is consistent with nonspecific an ongoing bronchitis. She likely has an inflammatory component related to the duration of the illness. She is not a smoker and does not have systemic symptoms, I do not believe she needs an antibiotic, at this time.  Nursing Notes Reviewed/ Care Coordinated Applicable Imaging Reviewed Interpretation of Laboratory Data incorporated into ED treatment  The patient appears reasonably screened and/or stabilized for discharge and I doubt any other medical condition or other [facility] requiring further screening, evaluation, or treatment in the ED at this time prior to discharge.  Plan: Home Medications- Albuterol, Prednisone; Home Treatments- rest; return here if the recommended treatment, does not improve the symptoms; Recommended follow up- PCP prn    Richarda Blade, MD 10/17/13 1735

## 2013-10-17 NOTE — ED Notes (Signed)
Pt c/o cough and SOB since yesterday.  Reports cough productive with yellow sputum.  Denies fever.

## 2013-12-15 ENCOUNTER — Emergency Department (HOSPITAL_COMMUNITY)
Admission: EM | Admit: 2013-12-15 | Discharge: 2013-12-15 | Disposition: A | Discharge status: Home / Self Care | Payer: 59 | Attending: Emergency Medicine | Admitting: Emergency Medicine

## 2013-12-15 ENCOUNTER — Emergency Department (HOSPITAL_COMMUNITY): Payer: 59

## 2013-12-15 ENCOUNTER — Encounter (HOSPITAL_COMMUNITY): Payer: Self-pay | Admitting: Emergency Medicine

## 2013-12-15 DIAGNOSIS — R5383 Other fatigue: Secondary | ICD-10-CM

## 2013-12-15 DIAGNOSIS — Z880 Allergy status to penicillin: Secondary | ICD-10-CM | POA: Insufficient documentation

## 2013-12-15 DIAGNOSIS — J40 Bronchitis, not specified as acute or chronic: Secondary | ICD-10-CM | POA: Insufficient documentation

## 2013-12-15 DIAGNOSIS — Z792 Long term (current) use of antibiotics: Secondary | ICD-10-CM | POA: Insufficient documentation

## 2013-12-15 DIAGNOSIS — Z79899 Other long term (current) drug therapy: Secondary | ICD-10-CM | POA: Insufficient documentation

## 2013-12-15 DIAGNOSIS — R5381 Other malaise: Secondary | ICD-10-CM | POA: Insufficient documentation

## 2013-12-15 DIAGNOSIS — J069 Acute upper respiratory infection, unspecified: Secondary | ICD-10-CM

## 2013-12-15 MED ORDER — HYDROCOD POLST-CHLORPHEN POLST 10-8 MG/5ML PO LQCR
5.0000 mL | Freq: Once | ORAL | Status: AC
  Administered 2013-12-15: 5 mL via ORAL
  Filled 2013-12-15: qty 5

## 2013-12-15 MED ORDER — ALBUTEROL SULFATE (2.5 MG/3ML) 0.083% IN NEBU
2.5000 mg | INHALATION_SOLUTION | Freq: Once | RESPIRATORY_TRACT | Status: AC
  Administered 2013-12-15: 2.5 mg via RESPIRATORY_TRACT
  Filled 2013-12-15: qty 3

## 2013-12-15 MED ORDER — PREDNISONE 50 MG PO TABS
60.0000 mg | ORAL_TABLET | Freq: Once | ORAL | Status: AC
  Administered 2013-12-15: 60 mg via ORAL
  Filled 2013-12-15 (×2): qty 1

## 2013-12-15 MED ORDER — PREDNISONE 10 MG PO TABS: ORAL_TABLET | ORAL | Status: DC

## 2013-12-15 MED ORDER — HYDROCOD POLST-CHLORPHEN POLST 10-8 MG/5ML PO LQCR: 5.0000 mL | Freq: Two times a day (BID) | ORAL | Status: DC | PRN

## 2013-12-15 MED ORDER — IPRATROPIUM-ALBUTEROL 0.5-2.5 (3) MG/3ML IN SOLN
3.0000 mL | Freq: Once | RESPIRATORY_TRACT | Status: AC
  Administered 2013-12-15: 3 mL via RESPIRATORY_TRACT
  Filled 2013-12-15: qty 3

## 2013-12-15 NOTE — ED Notes (Signed)
Productive cough with yellow mucous and wheezing x 1 wk.  Also c/o headaches.  Audible wheezing heard in triage. Used inhaler at home with no relief.

## 2013-12-15 NOTE — ED Provider Notes (Signed)
CSN: [id number]     Arrival date & time 12/15/13  0759 History   First MD Initiated Contact with Patient 12/15/13 0813     Chief Complaint  Patient presents with  . Wheezing     (Consider location/radiation/quality/duration/timing/severity/associated sxs/prior Treatment) Patient is a 33 y.o. female presenting with wheezing. The history is provided by the patient.  Wheezing Severity:  Moderate Severity compared to prior episodes:  Similar Onset quality:  Gradual Duration:  1 week Timing:  Intermittent Progression:  Worsening Chronicity:  Recurrent Context: dust, pollens and strong odors   Relieved by:  Nothing Worsened by:  Nothing tried Associated symptoms: cough, fatigue, rhinorrhea and shortness of breath   Associated symptoms: no chest pain and no fever   Risk factors: no prior hospitalizations and no suspected foreign body     Past Medical History  Diagnosis Date  . Bronchitis    Past Surgical History  Procedure Laterality Date  . Tubes tied    . Tubal ligation     No family history on file. History  Substance Use Topics  . Smoking status: Never Smoker   . Smokeless tobacco: Not on file  . Alcohol Use: No   OB History   Grav Para Term Preterm Abortions TAB SAB Ect Mult Living                 Review of Systems  Constitutional: Positive for fatigue. Negative for fever and activity change.       All ROS Neg except as noted in HPI  HENT: Positive for rhinorrhea. Negative for nosebleeds.   Eyes: Negative for photophobia and discharge.  Respiratory: Positive for cough, shortness of breath and wheezing.   Cardiovascular: Negative for chest pain and palpitations.  Gastrointestinal: Negative for abdominal pain and blood in stool.  Genitourinary: Negative for dysuria, frequency and hematuria.  Musculoskeletal: Negative for arthralgias, back pain and neck pain.  Skin: Negative.   Neurological: Negative for dizziness, seizures and speech difficulty.   Psychiatric/Behavioral: Negative for hallucinations and confusion.      Allergies  Penicillins  Home Medications   Prior to Admission medications   Medication Sig Start Date End Date Taking? Authorizing Provider  cefdinir (OMNICEF) 300 MG capsule Take 300 mg by mouth 2 (two) times daily. 10 day course. 1 day remaining in course    Historical Provider, MD  oseltamivir (TAMIFLU) 75 MG capsule Take 75 mg by mouth 2 (two) times daily.    Historical Provider, MD  predniSONE (DELTASONE) 10 MG tablet Take 2 tablets (20 mg total) by mouth 2 (two) times daily. 10/17/13   Richarda Blade, MD   BP 120/80  Pulse 96  Temp(Src) 98.1 F (36.7 C) (Oral)  Resp 20  Ht 5\' 4"  (1.626 m)  Wt 212 lb (96.163 kg)  BMI 36.37 kg/m2  SpO2 94%  LMP 11/20/2013 Physical Exam  Nursing note and vitals reviewed. Constitutional: She is oriented to person, place, and time. She appears well-developed and well-nourished.  Non-toxic appearance.  HENT:  Head: Normocephalic.  Right Ear: Tympanic membrane and external ear normal.  Left Ear: Tympanic membrane and external ear normal.  Nasal congestion  Eyes: EOM and lids are normal. Pupils are equal, round, and reactive to light.  Neck: Normal range of motion. Neck supple. Carotid bruit is not present.  Cardiovascular: Normal rate, regular rhythm, normal heart sounds, intact distal pulses and normal pulses.   Pulmonary/Chest: No respiratory distress. She has wheezes. She has rhonchi. She exhibits no retraction.  Abdominal: Soft. Bowel sounds are normal. There is no tenderness. There is no guarding.  Musculoskeletal: Normal range of motion.  Lymphadenopathy:       Head (right side): No submandibular adenopathy present.       Head (left side): No submandibular adenopathy present.    She has no cervical adenopathy.  Neurological: She is alert and oriented to person, place, and time. She has normal strength. No cranial nerve deficit or sensory deficit.  Skin: Skin is  warm and dry.  Psychiatric: She has a normal mood and affect. Her speech is normal.    ED Course  Procedures (including critical care time) Labs Review Labs Reviewed - No data to display  Imaging Review No results found.   EKG Interpretation None      Patient presents to the emergency department with a complaint of wheezing, cough, as well as some headache. She she denies fever. There's been no hemoptysis. His been no recent injury to the chest or rib areas. No recent operations or procedures.  The patient received albuterol, prednisone, and Tussionex with significant improvement in breathing and wheezing. The patient is able to ambulate with minimal problem.  The plan at this time is for the patient to continue her albuterol inhaler 2 puffs every 4 hours at home. She will be given a prescription for redness on a. She will also be asked to use Tussionex every 12 hours for cough and congestion. Patient is to return to the emergency department if any changes, problems, or concerns.    Final diagnoses:  None    *I have reviewed nursing notes, vital signs, and all appropriate lab and imaging results for this patient.Lenox Ahr, PA-C 12/15/13 1012

## 2013-12-15 NOTE — ED Notes (Signed)
Pt verbalized understanding to use caution and no driving within 4 hours of taking cough syrup due to med causes drowsiness

## 2013-12-15 NOTE — Discharge Instructions (Signed)
Your chest x-ray is negative for pneumonia, but does show some areas of bronchitis. Please increase fluids. Please wash hands frequently. Please use your albuterol inhaler 2 puffs every 4 hours. Please use prednisone taper as prescribed. Use Tussionex for cough and congestion. This medication may cause drowsiness, please use with caution. Bronchitis Bronchitis is swelling (inflammation) of the air tubes leading to your lungs (bronchi). This causes mucus and a cough. If the swelling gets bad, you may have trouble breathing. HOME CARE   Rest.  Drink enough fluids to keep your pee (urine) clear or pale yellow (unless you have a condition where you have to watch how much you drink).  Only take medicine as told by your doctor. If you were given antibiotic medicines, finish them even if you start to feel better.  Avoid smoke, irritating chemicals, and strong smells. These make the problem worse. Quit smoking if you smoke. This helps your lungs heal faster.  Use a cool mist humidifier. Change the water in the humidifier every day. You can also sit in the bathroom with hot shower running for 5 10 minutes. Keep the door closed.  See your health care provider as told.  Wash your hands often. GET HELP IF: Your problems do not get better after 1 week. GET HELP RIGHT AWAY IF:   Your fever gets worse.  You have chills.  Your chest hurts.  Your problems breathing get worse.  You have blood in your mucus.  You pass out (faint).  You feel lightheaded.  You have a bad headache.  You throw up (vomit) again and again. MAKE SURE YOU:  Understand these instructions.  Will watch your condition.  Will get help right away if you are not doing well or get worse. Document Released: 01/06/2008 Document Revised: 05/10/2013 Document Reviewed: 03/14/2013 [facility] Patient Information 2014 [place], [place].

## 2013-12-21 NOTE — ED Provider Notes (Signed)
Medical screening examination/treatment/procedure(s) were performed by non-physician practitioner and as supervising physician I was immediately available for consultation/collaboration.   EKG Interpretation None        Tanna Furry, MD 12/21/13 [phone]

## 2015-04-07 ENCOUNTER — Emergency Department (HOSPITAL_COMMUNITY)
Admission: EM | Admit: 2015-04-07 | Discharge: 2015-04-07 | Disposition: A | Discharge status: Home / Self Care | Payer: 59 | Attending: Emergency Medicine | Admitting: Emergency Medicine

## 2015-04-07 ENCOUNTER — Encounter (HOSPITAL_COMMUNITY): Payer: Self-pay | Admitting: Cardiology

## 2015-04-07 DIAGNOSIS — Z79899 Other long term (current) drug therapy: Secondary | ICD-10-CM | POA: Insufficient documentation

## 2015-04-07 DIAGNOSIS — J45909 Unspecified asthma, uncomplicated: Secondary | ICD-10-CM | POA: Diagnosis not present

## 2015-04-07 DIAGNOSIS — Z880 Allergy status to penicillin: Secondary | ICD-10-CM | POA: Insufficient documentation

## 2015-04-07 DIAGNOSIS — N390 Urinary tract infection, site not specified: Secondary | ICD-10-CM | POA: Insufficient documentation

## 2015-04-07 DIAGNOSIS — R1030 Lower abdominal pain, unspecified: Secondary | ICD-10-CM | POA: Diagnosis present

## 2015-04-07 HISTORY — DX: Unspecified asthma, uncomplicated: J45.909

## 2015-04-07 LAB — URINE MICROSCOPIC-ADD ON

## 2015-04-07 LAB — URINALYSIS, ROUTINE W REFLEX MICROSCOPIC
Bilirubin Urine: NEGATIVE
Glucose, UA: NEGATIVE mg/dL
Ketones, ur: NEGATIVE mg/dL
NITRITE: NEGATIVE
PH: 6.5 (ref 5.0–8.0)
PROTEIN: 30 mg/dL — AB
Specific Gravity, Urine: 1.025 (ref 1.005–1.030)
Urobilinogen, UA: 0.2 mg/dL (ref 0.0–1.0)

## 2015-04-07 MED ORDER — SULFAMETHOXAZOLE-TRIMETHOPRIM 800-160 MG PO TABS
1.0000 | ORAL_TABLET | Freq: Once | ORAL | Status: AC
  Administered 2015-04-07: 1 via ORAL
  Filled 2015-04-07: qty 1

## 2015-04-07 MED ORDER — IBUPROFEN 800 MG PO TABS
800.0000 mg | ORAL_TABLET | Freq: Once | ORAL | Status: AC
  Administered 2015-04-07: 800 mg via ORAL
  Filled 2015-04-07: qty 1

## 2015-04-07 MED ORDER — IBUPROFEN 800 MG PO TABS: 800.0000 mg | ORAL_TABLET | Freq: Three times a day (TID) | ORAL | Status: DC

## 2015-04-07 MED ORDER — SULFAMETHOXAZOLE-TRIMETHOPRIM 800-160 MG PO TABS: 1.0000 | ORAL_TABLET | Freq: Two times a day (BID) | ORAL | Status: AC

## 2015-04-07 MED ORDER — ONDANSETRON 8 MG PO TBDP
8.0000 mg | ORAL_TABLET | Freq: Once | ORAL | Status: AC
  Administered 2015-04-07: 8 mg via ORAL
  Filled 2015-04-07: qty 1

## 2015-04-07 NOTE — ED Notes (Signed)
Abdominal pain times 5 days.  Notice blood in her urine 3 days ago.  Pain with urination.

## 2015-04-07 NOTE — Discharge Instructions (Signed)

## 2015-04-07 NOTE — ED Provider Notes (Signed)
CSN: [id number]     Arrival date & time 04/07/15  1350 History   First MD Initiated Contact with Patient 04/07/15 1408     Chief Complaint  Patient presents with  . Abdominal Pain     (Consider location/radiation/quality/duration/timing/severity/associated sxs/prior Treatment) HPI Comments: The patient is a 34 year old female with history of urinary infections in the past presents with 5 days of gradual onset of persistent dysuria, mild hematuria and lower abdominal pain. It is gradually worsening, she has nausea, no fevers chills and no back pain. No medications given prior to arrival.  Patient is a 33 y.o. female presenting with abdominal pain. The history is provided by the patient.  Abdominal Pain Associated symptoms: dysuria, nausea and vomiting   Associated symptoms: no chills and no fever     Past Medical History  Diagnosis Date  . Bronchitis   . Asthma    Past Surgical History  Procedure Laterality Date  . Tubes tied    . Tubal ligation     History reviewed. No pertinent family history. Social History  Substance Use Topics  . Smoking status: Never Smoker   . Smokeless tobacco: None  . Alcohol Use: No   OB History    No data available     Review of Systems  Constitutional: Negative for fever and chills.  Gastrointestinal: Positive for nausea, vomiting and abdominal pain.  Genitourinary: Positive for dysuria.      Allergies  Penicillins  Home Medications   Prior to Admission medications   Medication Sig Start Date End Date Taking? Authorizing Provider  albuterol (PROVENTIL HFA;VENTOLIN HFA) 108 (90 BASE) MCG/ACT inhaler Inhale 2 puffs into the lungs every 4 (four) hours as needed for wheezing or shortness of breath.   Yes Historical Provider, MD  albuterol (PROVENTIL) (2.5 MG/3ML) 0.083% nebulizer solution Take 2.5 mg by nebulization every 6 (six) hours as needed for wheezing or shortness of breath.   Yes Historical Provider, MD  naproxen sodium (ALEVE)  220 MG tablet Take 660 mg by mouth 2 (two) times daily as needed (Pain).   Yes Historical Provider, MD  chlorpheniramine-HYDROcodone (TUSSIONEX PENNKINETIC ER) 10-8 MG/5ML LQCR Take 5 mLs by mouth every 12 (twelve) hours as needed for cough. 12/15/13   Lily Kocher, PA-C  dextromethorphan-guaiFENesin (ROBITUSSIN-DM) 10-100 MG/5ML liquid Take 10 mLs by mouth every 4 (four) hours as needed for cough.    Historical Provider, MD  ibuprofen (ADVIL,MOTRIN) 800 MG tablet Take 1 tablet (800 mg total) by mouth 3 (three) times daily. 04/07/15   Noemi Chapel, MD  predniSONE (DELTASONE) 10 MG tablet 5,4,3,2,1 - take with food 12/15/13   Lily Kocher, PA-C  sulfamethoxazole-trimethoprim (BACTRIM DS,SEPTRA DS) 800-160 MG per tablet Take 1 tablet by mouth 2 (two) times daily. 04/07/15 04/14/15  Noemi Chapel, MD   BP 112/86 mmHg  Pulse 67  Temp(Src) 98.7 F (37.1 C) (Oral)  Resp 16  Ht 5\' 4"  (1.626 m)  Wt 207 lb (93.895 kg)  BMI 35.51 kg/m2  SpO2 100%  LMP 03/21/2015 Physical Exam  Constitutional: She appears well-developed and well-nourished.  HENT:  Head: Normocephalic and atraumatic.  Eyes: Conjunctivae are normal. Right eye exhibits no discharge. Left eye exhibits no discharge.  Pulmonary/Chest: Effort normal. No respiratory distress.  Abdominal: There is tenderness ( Mild suprapubic tenderness, no guarding, no pain in McBurney's point, no Murphy sign).  No CVA tenderness  Neurological: She is alert. Coordination normal.  Skin: Skin is warm and dry. No rash noted. She is not diaphoretic.  No erythema.  Psychiatric: She has a normal mood and affect.  Nursing note and vitals reviewed.   ED Course  Procedures (including critical care time) Labs Review Labs Reviewed  URINALYSIS, ROUTINE W REFLEX MICROSCOPIC (NOT AT [facility]) - Abnormal; Notable for the following:    APPearance HAZY (*)    Hgb urine dipstick LARGE (*)    Protein, ur 30 (*)    Leukocytes, UA SMALL (*)    All other components within  normal limits  URINE MICROSCOPIC-ADD ON - Abnormal; Notable for the following:    Bacteria, UA FEW (*)    All other components within normal limits    Imaging Review No results found. I have personally reviewed and evaluated these images and lab results as part of my medical decision-making.    MDM   Final diagnoses:  UTI (lower urinary tract infection)    Vitals unremarkable, possible urinary infection, we'll order labs if urinalysis does not reveal an obvious source.  UA with mild UTI - pt stable for d/c.  Meds given in ED:  Medications  ibuprofen (ADVIL,MOTRIN) tablet 800 mg (not administered)  ondansetron (ZOFRAN-ODT) disintegrating tablet 8 mg (not administered)  sulfamethoxazole-trimethoprim (BACTRIM DS,SEPTRA DS) 800-160 MG per tablet 1 tablet (not administered)    New Prescriptions   IBUPROFEN (ADVIL,MOTRIN) 800 MG TABLET    Take 1 tablet (800 mg total) by mouth 3 (three) times daily.   SULFAMETHOXAZOLE-TRIMETHOPRIM (BACTRIM DS,SEPTRA DS) 800-160 MG PER TABLET    Take 1 tablet by mouth 2 (two) times daily.      Noemi Chapel, MD 04/07/15 [phone]

## 2015-12-17 ENCOUNTER — Encounter (HOSPITAL_COMMUNITY): Payer: Self-pay | Admitting: Emergency Medicine

## 2015-12-17 ENCOUNTER — Emergency Department (HOSPITAL_COMMUNITY)
Admission: EM | Admit: 2015-12-17 | Discharge: 2015-12-17 | Disposition: A | Discharge status: Home / Self Care | Payer: BLUE CROSS/BLUE SHIELD | Attending: Emergency Medicine | Admitting: Emergency Medicine

## 2015-12-17 ENCOUNTER — Emergency Department (HOSPITAL_COMMUNITY): Payer: BLUE CROSS/BLUE SHIELD

## 2015-12-17 DIAGNOSIS — R0981 Nasal congestion: Secondary | ICD-10-CM | POA: Diagnosis not present

## 2015-12-17 DIAGNOSIS — Z791 Long term (current) use of non-steroidal anti-inflammatories (NSAID): Secondary | ICD-10-CM | POA: Diagnosis not present

## 2015-12-17 DIAGNOSIS — R1013 Epigastric pain: Secondary | ICD-10-CM

## 2015-12-17 DIAGNOSIS — R0602 Shortness of breath: Secondary | ICD-10-CM | POA: Insufficient documentation

## 2015-12-17 DIAGNOSIS — J45909 Unspecified asthma, uncomplicated: Secondary | ICD-10-CM | POA: Insufficient documentation

## 2015-12-17 DIAGNOSIS — M549 Dorsalgia, unspecified: Secondary | ICD-10-CM | POA: Diagnosis not present

## 2015-12-17 DIAGNOSIS — Z79899 Other long term (current) drug therapy: Secondary | ICD-10-CM | POA: Diagnosis not present

## 2015-12-17 LAB — COMPREHENSIVE METABOLIC PANEL
ALBUMIN: 3.6 g/dL (ref 3.5–5.0)
ALT: 14 U/L (ref 14–54)
ANION GAP: 7 (ref 5–15)
AST: 15 U/L (ref 15–41)
Alkaline Phosphatase: 69 U/L (ref 38–126)
BUN: 10 mg/dL (ref 6–20)
CHLORIDE: 105 mmol/L (ref 101–111)
CO2: 24 mmol/L (ref 22–32)
Calcium: 8.6 mg/dL — ABNORMAL LOW (ref 8.9–10.3)
Creatinine, Ser: 0.67 mg/dL (ref 0.44–1.00)
GFR calc Af Amer: >60 mL/min (ref >60)
GFR calc non Af Amer: >60 mL/min (ref >60)
GLUCOSE: 100 mg/dL — AB (ref 65–99)
POTASSIUM: 3.8 mmol/L (ref 3.5–5.1)
SODIUM: 136 mmol/L (ref 135–145)
Total Bilirubin: 0.5 mg/dL (ref 0.3–1.2)
Total Protein: 7.5 g/dL (ref 6.5–8.1)

## 2015-12-17 LAB — URINALYSIS, ROUTINE W REFLEX MICROSCOPIC
Bilirubin Urine: NEGATIVE
Glucose, UA: NEGATIVE mg/dL
Leukocytes, UA: NEGATIVE
NITRITE: NEGATIVE
Protein, ur: NEGATIVE mg/dL
pH: 6 (ref 5.0–8.0)

## 2015-12-17 LAB — URINE MICROSCOPIC-ADD ON
Bacteria, UA: NONE SEEN
Squamous Epithelial / LPF: NONE SEEN
WBC, UA: NONE SEEN WBC/hpf (ref 0–5)

## 2015-12-17 LAB — CBC
HCT: 41 % (ref 36.0–46.0)
HEMOGLOBIN: 13.3 g/dL (ref 12.0–15.0)
MCH: 26.5 pg (ref 26.0–34.0)
MCHC: 32.4 g/dL (ref 30.0–36.0)
MCV: 81.7 fL (ref 78.0–100.0)
Platelets: 297 10*3/uL (ref 150–400)
RBC: 5.02 MIL/uL (ref 3.87–5.11)
RDW: 13.8 % (ref 11.5–15.5)
WBC: 8.1 10*3/uL (ref 4.0–10.5)

## 2015-12-17 LAB — I-STAT BETA HCG BLOOD, ED (MC, WL, AP ONLY): I-stat hCG, quantitative: <5 m[IU]/mL (ref <5)

## 2015-12-17 LAB — LIPASE, BLOOD: Lipase: 20 U/L (ref 11–51)

## 2015-12-17 MED ORDER — ONDANSETRON HCL 4 MG/2ML IJ SOLN
4.0000 mg | Freq: Once | INTRAMUSCULAR | Status: AC
  Administered 2015-12-17: 4 mg via INTRAVENOUS
  Filled 2015-12-17: qty 2

## 2015-12-17 MED ORDER — SODIUM CHLORIDE 0.9 % IV SOLN
INTRAVENOUS | Status: DC
  Administered 2015-12-17: 11:00:00 via INTRAVENOUS

## 2015-12-17 MED ORDER — SODIUM CHLORIDE 0.9 % IV BOLUS (SEPSIS)
1000.0000 mL | Freq: Once | INTRAVENOUS | Status: AC
  Administered 2015-12-17: 1000 mL via INTRAVENOUS

## 2015-12-17 MED ORDER — HYDROCODONE-ACETAMINOPHEN 5-325 MG PO TABS: 1.0000 | ORAL_TABLET | Freq: Four times a day (QID) | ORAL | Status: DC | PRN

## 2015-12-17 MED ORDER — FENTANYL CITRATE (PF) 100 MCG/2ML IJ SOLN
50.0000 ug | Freq: Once | INTRAMUSCULAR | Status: AC
  Administered 2015-12-17: 50 ug via INTRAVENOUS
  Filled 2015-12-17: qty 2

## 2015-12-17 MED ORDER — IOPAMIDOL (ISOVUE-300) INJECTION 61%
100.0000 mL | Freq: Once | INTRAVENOUS | Status: AC | PRN
  Administered 2015-12-17: 100 mL via INTRAVENOUS

## 2015-12-17 MED ORDER — FAMOTIDINE 20 MG PO TABS: 20.0000 mg | ORAL_TABLET | Freq: Two times a day (BID) | ORAL | Status: DC

## 2015-12-17 NOTE — ED Notes (Signed)
Pt reports epigastric pain since yesterday, denies n/v/d or urinary symptoms.

## 2015-12-17 NOTE — Discharge Instructions (Signed)
Workup for the epigastric abdominal pain without any acute findings. Trial of Pepcid for the next 2 weeks to see if it improves the pain. Also take the hydrocodone as needed for pain. Return for any new or worse symptoms.

## 2015-12-17 NOTE — ED Provider Notes (Signed)
CSN: [id number]     Arrival date & time 12/17/15  [id number] History  By signing my name below, I, Emmanuella Mensah, attest that this documentation has been prepared under the direction and in the presence of Fredia Sorrow, MD. Electronically Signed: Judithann Sauger, ED Scribe. 12/17/2015. 8:45 AM.    Chief Complaint  Patient presents with  . Abdominal Pain   Patient is a 35 y.o. female presenting with abdominal pain. The history is provided by the patient. No language interpreter was used.  Abdominal Pain Pain location:  Epigastric Pain radiates to:  Back Pain severity:  Moderate Onset quality:  Gradual Duration:  1 day Timing:  Constant Progression:  Worsening Chronicity:  New Context: not medication withdrawal and not sick contacts   Relieved by:  None tried Worsened by:  Nothing tried Ineffective treatments:  None tried Associated symptoms: shortness of breath   Associated symptoms: no chest pain, no chills, no cough, no diarrhea, no dysuria, no fever, no hematuria, no nausea, no sore throat and no vomiting    HPI Comments: Rachel Jefferson is a 35 y.o. female who presents to the Emergency Department complaining of gradually worsening constant 10/10 epigastric pain onset yesterday. Pt explains that the pain intermittently radiates to lower back. She states that she has had these symptoms in the past but does not remember what she was diagnosed with. Pt denies that she is on any new medication. No alleviating factors noted. Pt has not tried any medications PTA. No sick contacts noted as well. She denies any fever, chills, n/v/d, dysuria, or hematuria.    Past Medical History  Diagnosis Date  . Bronchitis   . Asthma    Past Surgical History  Procedure Laterality Date  . Tubes tied    . Tubal ligation     History reviewed. No pertinent family history. Social History  Substance Use Topics  . Smoking status: Never Smoker   . Smokeless tobacco: None  . Alcohol Use: No    OB History    No data available     Review of Systems  Constitutional: Negative for fever and chills.  HENT: Positive for congestion and rhinorrhea. Negative for sore throat.   Eyes: Negative for visual disturbance.  Respiratory: Positive for shortness of breath. Negative for cough.   Cardiovascular: Negative for chest pain and leg swelling.  Gastrointestinal: Positive for abdominal pain. Negative for nausea, vomiting and diarrhea.  Genitourinary: Negative for dysuria and hematuria.  Musculoskeletal: Positive for back pain.  Skin: Negative for rash.  Neurological: Negative for headaches.  Hematological: Does not bruise/bleed easily.  Psychiatric/Behavioral: Negative for confusion.      Allergies  Penicillins  Home Medications   Prior to Admission medications   Medication Sig Start Date End Date Taking? Authorizing Provider  albuterol (PROVENTIL) (2.5 MG/3ML) 0.083% nebulizer solution Take 2.5 mg by nebulization every 6 (six) hours as needed for wheezing or shortness of breath.   Yes Historical Provider, MD  fluticasone furoate-vilanterol (BREO ELLIPTA) 100-25 MCG/INH AEPB Inhale 1 puff into the lungs daily.   Yes Historical Provider, MD  naproxen sodium (ALEVE) 220 MG tablet Take 660 mg by mouth 2 (two) times daily as needed (Pain).   Yes Historical Provider, MD  famotidine (PEPCID) 20 MG tablet Take 1 tablet (20 mg total) by mouth 2 (two) times daily. 12/17/15   Fredia Sorrow, MD  HYDROcodone-acetaminophen (NORCO/VICODIN) 5-325 MG tablet Take 1-2 tablets by mouth every 6 (six) hours as needed. 12/17/15   Fredia Sorrow,  MD  predniSONE (DELTASONE) 10 MG tablet 5,4,3,2,1 - take with food Patient not taking: Reported on 12/17/2015 12/15/13   Lily Kocher, PA-C   BP 142/86 mmHg  Pulse 70  Temp(Src) 97.5 F (36.4 C) (Oral)  Resp 16  Ht 5\' 4"  (1.626 m)  Wt 90.719 kg  BMI 34.31 kg/m2  SpO2 98%  LMP 12/16/2015 Physical Exam  Constitutional: She is oriented to person,  place, and time. She appears well-developed and well-nourished.  HENT:  Head: Normocephalic and atraumatic.  Mouth/Throat: Oropharynx is clear and moist.  Eyes: Pupils are equal, round, and reactive to light.  Eyes track normal Sclera normal  Cardiovascular: Normal rate and regular rhythm.   Pulmonary/Chest: Effort normal and breath sounds normal.  Lungs are clear bilaterally  Abdominal: Bowel sounds are normal. There is tenderness. There is no guarding.  Epigastric tenderness but no guarding  Musculoskeletal:  No ankle swelling  Neurological: She is alert and oriented to person, place, and time.  Skin: Skin is warm and dry.  Psychiatric: She has a normal mood and affect.  Nursing note and vitals reviewed.   ED Course  Procedures (including critical care time) DIAGNOSTIC STUDIES: Oxygen Saturation is 98% on RA, normal by my interpretation.    COORDINATION OF CARE: 8:43 AM- Pt advised of plan for treatment and pt agrees. Pt will receive lab work and a CT scan for further evaluation.    Labs Review Labs Reviewed  COMPREHENSIVE METABOLIC PANEL - Abnormal; Notable for the following:    Glucose, Bld 100 (*)    Calcium 8.6 (*)    All other components within normal limits  URINALYSIS, ROUTINE W REFLEX MICROSCOPIC (NOT AT [facility]) - Abnormal; Notable for the following:    Specific Gravity, Urine <1.005 (*)    Hgb urine dipstick SMALL (*)    Ketones, ur TRACE (*)    All other components within normal limits  LIPASE, BLOOD  CBC  URINE MICROSCOPIC-ADD ON  I-STAT BETA HCG BLOOD, ED ([facility], [facility], [facility] ONLY)   Results for orders placed or performed during the hospital encounter of 12/17/15  Lipase, blood  Result Value Ref Range   Lipase 20 11 - 51 U/L  Comprehensive metabolic panel  Result Value Ref Range   Sodium 136 135 - 145 mmol/L   Potassium 3.8 3.5 - 5.1 mmol/L   Chloride 105 101 - 111 mmol/L   CO2 24 22 - 32 mmol/L   Glucose, Bld 100 (H) 65 - 99 mg/dL   BUN 10 6 - 20 mg/dL    Creatinine, Ser 0.67 0.44 - 1.00 mg/dL   Calcium 8.6 (L) 8.9 - 10.3 mg/dL   Total Protein 7.5 6.5 - 8.1 g/dL   Albumin 3.6 3.5 - 5.0 g/dL   AST 15 15 - 41 U/L   ALT 14 14 - 54 U/L   Alkaline Phosphatase 69 38 - 126 U/L   Total Bilirubin 0.5 0.3 - 1.2 mg/dL   GFR calc non Af Amer >60 >60 mL/min   GFR calc Af Amer >60 >60 mL/min   Anion gap 7 5 - 15  CBC  Result Value Ref Range   WBC 8.1 4.0 - 10.5 K/uL   RBC 5.02 3.87 - 5.11 MIL/uL   Hemoglobin 13.3 12.0 - 15.0 g/dL   HCT 41.0 36.0 - 46.0 %   MCV 81.7 78.0 - 100.0 fL   MCH 26.5 26.0 - 34.0 pg   MCHC 32.4 30.0 - 36.0 g/dL   RDW 13.8 11.5 -  15.5 %   Platelets 297 150 - 400 K/uL  Urinalysis, Routine w reflex microscopic  Result Value Ref Range   Color, Urine YELLOW YELLOW   APPearance CLEAR CLEAR   Specific Gravity, Urine <1.005 (L) 1.005 - 1.030   pH 6.0 5.0 - 8.0   Glucose, UA NEGATIVE NEGATIVE mg/dL   Hgb urine dipstick SMALL (A) NEGATIVE   Bilirubin Urine NEGATIVE NEGATIVE   Ketones, ur TRACE (A) NEGATIVE mg/dL   Protein, ur NEGATIVE NEGATIVE mg/dL   Nitrite NEGATIVE NEGATIVE   Leukocytes, UA NEGATIVE NEGATIVE  Urine microscopic-add on  Result Value Ref Range   Squamous Epithelial / LPF NONE SEEN NONE SEEN   WBC, UA NONE SEEN 0 - 5 WBC/hpf   RBC / HPF 0-5 0 - 5 RBC/hpf   Bacteria, UA NONE SEEN NONE SEEN  I-Stat Beta hCG blood, ED ([facility], [facility], [facility] only)  Result Value Ref Range   I-stat hCG, quantitative <5.0 <5 mIU/mL   Comment 3             Imaging Review Ct Abdomen Pelvis W Contrast  12/17/2015  CLINICAL DATA:  Epigastric abdominal pain radiating to lower back since yesterday. EXAM: CT ABDOMEN AND PELVIS WITH CONTRAST TECHNIQUE: Multidetector CT imaging of the abdomen and pelvis was performed using the standard protocol following bolus administration of intravenous contrast. CONTRAST:  173mL ISOVUE-300 IOPAMIDOL (ISOVUE-300) INJECTION 61% COMPARISON:  11/07/2012 FINDINGS: Lower chest:  No acute findings. Hepatobiliary:  No masses or other significant abnormality. Gallbladder is unremarkable. Pancreas: No mass, inflammatory changes, or other significant abnormality. Spleen: Within normal limits in size and appearance. Adrenals/Urinary Tract: No masses identified. No evidence of hydronephrosis. Stomach/Bowel: No evidence of obstruction, inflammatory process, or abnormal fluid collections. Normal appendix visualized. Vascular/Lymphatic: No pathologically enlarged lymph nodes. No evidence of abdominal aortic aneurysm. Reproductive: Multiple small uterine fibroids are seen which appear increased since previous study. Largest in the left anterior corpus measures approximately 3 cm. No adnexal masses identified. No evidence of free fluid. Other: None. Musculoskeletal:  No suspicious bone lesions identified. IMPRESSION: Multiple small uterine fibroids, largest measuring approximately 3 cm, which appear increased since 2014 study. No other significant abnormality identified. Electronically Signed   By: Earle Gell M.D.   On: 12/17/2015 10:18     Fredia Sorrow, MD has personally reviewed and evaluated these images and lab results as part of his medical decision-making.   EKG Interpretation None      MDM   Final diagnoses:  Abdominal pain, acute, epigastric    Workup for the epigastric abdominal pain without any acute findings by lab or CT scan. Doubt that the uterine fibroids are the cause of the epigastric pain. Will treat symptomatically including a trial of Pepcid in case it's a peptic ulcer disease process. Patient will return for any new or worse symptoms. Patient nontoxic no acute distress.  I personally performed the services described in this documentation, which was scribed in my presence. The recorded information has been reviewed and is accurate.     Fredia Sorrow, MD 12/17/15 1115

## 2015-12-25 ENCOUNTER — Emergency Department (HOSPITAL_COMMUNITY): Payer: BLUE CROSS/BLUE SHIELD

## 2015-12-25 ENCOUNTER — Encounter (HOSPITAL_COMMUNITY): Payer: Self-pay | Admitting: Emergency Medicine

## 2015-12-25 ENCOUNTER — Emergency Department (HOSPITAL_COMMUNITY)
Admission: EM | Admit: 2015-12-25 | Discharge: 2015-12-25 | Disposition: A | Discharge status: Home / Self Care | Payer: BLUE CROSS/BLUE SHIELD | Attending: Emergency Medicine | Admitting: Emergency Medicine

## 2015-12-25 DIAGNOSIS — R42 Dizziness and giddiness: Secondary | ICD-10-CM | POA: Diagnosis present

## 2015-12-25 DIAGNOSIS — J45909 Unspecified asthma, uncomplicated: Secondary | ICD-10-CM | POA: Insufficient documentation

## 2015-12-25 DIAGNOSIS — J069 Acute upper respiratory infection, unspecified: Secondary | ICD-10-CM | POA: Diagnosis not present

## 2015-12-25 LAB — CBC WITH DIFFERENTIAL/PLATELET
BASOS ABS: 0 10*3/uL (ref 0.0–0.1)
Basophils Relative: 0 %
EOS ABS: 0.7 10*3/uL (ref 0.0–0.7)
EOS PCT: 7 %
HCT: 39.6 % (ref 36.0–46.0)
HEMOGLOBIN: 13 g/dL (ref 12.0–15.0)
LYMPHS PCT: 40 %
Lymphs Abs: 4 10*3/uL (ref 0.7–4.0)
MCH: 26.6 pg (ref 26.0–34.0)
MCHC: 32.8 g/dL (ref 30.0–36.0)
MCV: 81 fL (ref 78.0–100.0)
Monocytes Absolute: 0.8 10*3/uL (ref 0.1–1.0)
Monocytes Relative: 8 %
NEUTROS PCT: 45 %
Neutro Abs: 4.7 10*3/uL (ref 1.7–7.7)
PLATELETS: 331 10*3/uL (ref 150–400)
RBC: 4.89 MIL/uL (ref 3.87–5.11)
RDW: 13.7 % (ref 11.5–15.5)
WBC: 10.2 10*3/uL (ref 4.0–10.5)

## 2015-12-25 LAB — BASIC METABOLIC PANEL
ANION GAP: 6 (ref 5–15)
BUN: 12 mg/dL (ref 6–20)
CO2: 27 mmol/L (ref 22–32)
Calcium: 8.9 mg/dL (ref 8.9–10.3)
Chloride: 103 mmol/L (ref 101–111)
Creatinine, Ser: 0.65 mg/dL (ref 0.44–1.00)
GFR calc Af Amer: >60 mL/min (ref >60)
Glucose, Bld: 82 mg/dL (ref 65–99)
POTASSIUM: 3.7 mmol/L (ref 3.5–5.1)
SODIUM: 136 mmol/L (ref 135–145)

## 2015-12-25 LAB — URINALYSIS, ROUTINE W REFLEX MICROSCOPIC
Bilirubin Urine: NEGATIVE
Glucose, UA: NEGATIVE mg/dL
Ketones, ur: NEGATIVE mg/dL
Leukocytes, UA: NEGATIVE
NITRITE: NEGATIVE
PROTEIN: NEGATIVE mg/dL
Specific Gravity, Urine: >1.03 — ABNORMAL HIGH (ref 1.005–1.030)
pH: 5.5 (ref 5.0–8.0)

## 2015-12-25 LAB — URINE MICROSCOPIC-ADD ON

## 2015-12-25 MED ORDER — IPRATROPIUM-ALBUTEROL 0.5-2.5 (3) MG/3ML IN SOLN
3.0000 mL | Freq: Once | RESPIRATORY_TRACT | Status: AC
  Administered 2015-12-25: 3 mL via RESPIRATORY_TRACT
  Filled 2015-12-25: qty 3

## 2015-12-25 MED ORDER — ALBUTEROL SULFATE HFA 108 (90 BASE) MCG/ACT IN AERS
2.0000 | INHALATION_SPRAY | RESPIRATORY_TRACT | Status: DC | PRN
  Administered 2015-12-25: 2 via RESPIRATORY_TRACT
  Filled 2015-12-25: qty 6.7

## 2015-12-25 NOTE — ED Notes (Signed)
Pt c/o cough/congestion/wheezing x 2-3 weeks. Pt reports worsening symptoms and dizziness x 2-3 days. Pt sent from urgent care for possible dehydration.

## 2015-12-25 NOTE — ED Provider Notes (Signed)
CSN: [id number]     Arrival date & time 12/25/15  1521 History   First MD Initiated Contact with Patient 12/25/15 1649     Chief Complaint  Patient presents with  . Dizziness     (Consider location/radiation/quality/duration/timing/severity/associated sxs/prior Treatment) Patient is a 35 y.o. female presenting with dizziness. The history is provided by the patient (Patient complains of nasal congestion and wheezing and weakness for a few days.).  Dizziness Quality:  Lightheadedness Severity:  Mild Onset quality:  Gradual Timing:  Intermittent Progression:  Waxing and waning Chronicity:  New Associated symptoms: no chest pain, no diarrhea and no headaches     Past Medical History  Diagnosis Date  . Bronchitis   . Asthma    Past Surgical History  Procedure Laterality Date  . Tubes tied    . Tubal ligation     No family history on file. Social History  Substance Use Topics  . Smoking status: Never Smoker   . Smokeless tobacco: None  . Alcohol Use: No   OB History    No data available     Review of Systems  Constitutional: Negative for appetite change and fatigue.  HENT: Positive for congestion. Negative for ear discharge and sinus pressure.   Eyes: Negative for discharge.  Respiratory: Positive for wheezing. Negative for cough.   Cardiovascular: Negative for chest pain.  Gastrointestinal: Negative for abdominal pain and diarrhea.  Genitourinary: Negative for frequency and hematuria.  Musculoskeletal: Negative for back pain.  Skin: Negative for rash.  Neurological: Positive for dizziness. Negative for seizures and headaches.  Psychiatric/Behavioral: Negative for hallucinations.      Allergies  Penicillins  Home Medications   Prior to Admission medications   Medication Sig Start Date End Date Taking? Authorizing Provider  albuterol (PROVENTIL) (2.5 MG/3ML) 0.083% nebulizer solution Take 2.5 mg by nebulization every 6 (six) hours as needed for wheezing or  shortness of breath.    Historical Provider, MD  famotidine (PEPCID) 20 MG tablet Take 1 tablet (20 mg total) by mouth 2 (two) times daily. 12/17/15   Fredia Sorrow, MD  fluticasone furoate-vilanterol (BREO ELLIPTA) 100-25 MCG/INH AEPB Inhale 1 puff into the lungs daily.    Historical Provider, MD  HYDROcodone-acetaminophen (NORCO/VICODIN) 5-325 MG tablet Take 1-2 tablets by mouth every 6 (six) hours as needed. 12/17/15   Fredia Sorrow, MD  naproxen sodium (ALEVE) 220 MG tablet Take 660 mg by mouth 2 (two) times daily as needed (Pain).    Historical Provider, MD  predniSONE (DELTASONE) 10 MG tablet 5,4,3,2,1 - take with food Patient not taking: Reported on 12/17/2015 12/15/13   Lily Kocher, PA-C   BP 136/86 mmHg  Pulse 81  Temp(Src) 98 F (36.7 C) (Oral)  Resp 18  Ht 5\' 4"  (1.626 m)  Wt 199 lb (90.266 kg)  BMI 34.14 kg/m2  SpO2 100%  LMP 12/16/2015 Physical Exam  Constitutional: She is oriented to person, place, and time. She appears well-developed.  HENT:  Head: Normocephalic.  Eyes: Conjunctivae and EOM are normal. No scleral icterus.  Neck: Neck supple. No thyromegaly present.  Cardiovascular: Normal rate and regular rhythm.  Exam reveals no gallop and no friction rub.   No murmur heard. Pulmonary/Chest: No stridor. She has wheezes. She has no rales. She exhibits no tenderness.  Abdominal: She exhibits no distension. There is no tenderness. There is no rebound.  Musculoskeletal: Normal range of motion. She exhibits no edema.  Lymphadenopathy:    She has no cervical adenopathy.  Neurological: She  is oriented to person, place, and time. She exhibits normal muscle tone. Coordination normal.  Skin: No rash noted. No erythema.  Psychiatric: She has a normal mood and affect. Her behavior is normal.    ED Course  Procedures (including critical care time) Labs Review Labs Reviewed  URINALYSIS, ROUTINE W REFLEX MICROSCOPIC (NOT AT [facility]) - Abnormal; Notable for the following:     Specific Gravity, Urine >1.030 (*)    Hgb urine dipstick TRACE (*)    All other components within normal limits  URINE MICROSCOPIC-ADD ON - Abnormal; Notable for the following:    Squamous Epithelial / LPF 0-5 (*)    Bacteria, UA FEW (*)    All other components within normal limits  CBC WITH DIFFERENTIAL/PLATELET  BASIC METABOLIC PANEL    Imaging Review Dg Chest 2 View  12/25/2015  CLINICAL DATA:  Cough and wheezing for 2 weeks EXAM: CHEST  2 VIEW COMPARISON:  Dec 16, 2014 FINDINGS: The heart size and mediastinal contours are within normal limits. There is no focal infiltrate, pulmonary edema, or pleural effusion. The visualized skeletal structures are unremarkable. IMPRESSION: No active cardiopulmonary disease. Electronically Signed   By: Abelardo Diesel M.D.   On: 12/25/2015 17:50   I have personally reviewed and evaluated these images and lab results as part of my medical decision-making.   EKG Interpretation None      MDM   Final diagnoses:  URI, acute    Patient was given albuterol and Atrovent neb treatment. Patient's wheezing improved. Labs and x-rays unremarkable. Diagnosis URI with bronchospasm patient will continue her albuterol every 4 hours as needed    Milton Ferguson, MD 12/25/15 1806

## 2015-12-25 NOTE — Discharge Instructions (Signed)
Use your albuterol inhaler every 4 hours as needed take Tylenol for pain. Follow-up with your doctor as needed

## 2016-11-28 ENCOUNTER — Emergency Department (HOSPITAL_COMMUNITY): Payer: BLUE CROSS/BLUE SHIELD

## 2016-11-28 ENCOUNTER — Emergency Department (HOSPITAL_COMMUNITY)
Admission: EM | Admit: 2016-11-28 | Discharge: 2016-11-28 | Disposition: A | Discharge status: Home / Self Care | Payer: BLUE CROSS/BLUE SHIELD | Attending: Emergency Medicine | Admitting: Emergency Medicine

## 2016-11-28 ENCOUNTER — Encounter (HOSPITAL_COMMUNITY): Payer: Self-pay

## 2016-11-28 DIAGNOSIS — R079 Chest pain, unspecified: Secondary | ICD-10-CM | POA: Diagnosis not present

## 2016-11-28 DIAGNOSIS — S8992XA Unspecified injury of left lower leg, initial encounter: Secondary | ICD-10-CM | POA: Diagnosis present

## 2016-11-28 DIAGNOSIS — Y9389 Activity, other specified: Secondary | ICD-10-CM | POA: Insufficient documentation

## 2016-11-28 DIAGNOSIS — Y999 Unspecified external cause status: Secondary | ICD-10-CM | POA: Insufficient documentation

## 2016-11-28 DIAGNOSIS — S8012XA Contusion of left lower leg, initial encounter: Secondary | ICD-10-CM | POA: Insufficient documentation

## 2016-11-28 DIAGNOSIS — Y92410 Unspecified street and highway as the place of occurrence of the external cause: Secondary | ICD-10-CM | POA: Insufficient documentation

## 2016-11-28 DIAGNOSIS — J45909 Unspecified asthma, uncomplicated: Secondary | ICD-10-CM | POA: Insufficient documentation

## 2016-11-28 DIAGNOSIS — T07XXXA Unspecified multiple injuries, initial encounter: Secondary | ICD-10-CM

## 2016-11-28 MED ORDER — HYDROCODONE-ACETAMINOPHEN 5-325 MG PO TABS
1.0000 | ORAL_TABLET | Freq: Once | ORAL | Status: AC
  Administered 2016-11-28: 1 via ORAL
  Filled 2016-11-28: qty 1

## 2016-11-28 MED ORDER — IBUPROFEN 400 MG PO TABS
600.0000 mg | ORAL_TABLET | Freq: Once | ORAL | Status: AC
  Administered 2016-11-28: 600 mg via ORAL
  Filled 2016-11-28: qty 2

## 2016-11-28 MED ORDER — IBUPROFEN 600 MG PO TABS: 600.0000 mg | ORAL_TABLET | Freq: Four times a day (QID) | ORAL | 0 refills | Status: DC | PRN

## 2016-11-28 NOTE — ED Notes (Signed)
Patient transported to X-ray 
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
## 2016-11-28 NOTE — ED Notes (Signed)
Pt remains in xray- pt family and [employer] officer present in room a/w pt return from xray

## 2017-06-03 DIAGNOSIS — J111 Influenza due to unidentified influenza virus with other respiratory manifestations: Secondary | ICD-10-CM | POA: Diagnosis not present

## 2017-08-10 DIAGNOSIS — J069 Acute upper respiratory infection, unspecified: Secondary | ICD-10-CM | POA: Diagnosis not present

## 2017-08-10 DIAGNOSIS — J209 Acute bronchitis, unspecified: Secondary | ICD-10-CM | POA: Diagnosis not present

## 2017-11-09 ENCOUNTER — Other Ambulatory Visit (HOSPITAL_COMMUNITY)
Admission: RE | Admit: 2017-11-09 | Discharge: 2017-11-09 | Disposition: A | Discharge status: Home / Self Care | Payer: BLUE CROSS/BLUE SHIELD | Source: Ambulatory Visit | Attending: Obstetrics & Gynecology | Admitting: Obstetrics & Gynecology

## 2017-11-09 ENCOUNTER — Ambulatory Visit (INDEPENDENT_AMBULATORY_CARE_PROVIDER_SITE_OTHER): Payer: BLUE CROSS/BLUE SHIELD | Admitting: Obstetrics & Gynecology

## 2017-11-09 ENCOUNTER — Other Ambulatory Visit: Payer: Self-pay

## 2017-11-09 ENCOUNTER — Encounter: Payer: Self-pay | Admitting: Obstetrics & Gynecology

## 2017-11-09 ENCOUNTER — Encounter (INDEPENDENT_AMBULATORY_CARE_PROVIDER_SITE_OTHER): Payer: Self-pay

## 2017-11-09 VITALS — BP 118/76 | HR 79 | Ht 64.0 in | Wt 194.0 lb

## 2017-11-09 DIAGNOSIS — Z01419 Encounter for gynecological examination (general) (routine) without abnormal findings: Secondary | ICD-10-CM

## 2017-11-09 DIAGNOSIS — Z01411 Encounter for gynecological examination (general) (routine) with abnormal findings: Secondary | ICD-10-CM

## 2017-11-09 DIAGNOSIS — N920 Excessive and frequent menstruation with regular cycle: Secondary | ICD-10-CM

## 2017-11-09 DIAGNOSIS — N946 Dysmenorrhea, unspecified: Secondary | ICD-10-CM | POA: Diagnosis not present

## 2017-11-09 DIAGNOSIS — D259 Leiomyoma of uterus, unspecified: Secondary | ICD-10-CM

## 2017-11-09 MED ORDER — MEGESTROL ACETATE 40 MG PO TABS: ORAL_TABLET | ORAL | 3 refills | Status: DC

## 2017-11-09 NOTE — Progress Notes (Signed)
Subjective:     Rachel Jefferson is a 37 y.o. female here for a routine exam.  Patient's last menstrual period was 10/18/2017. [id number] Birth Control Method:  Tubal ligation Menstrual Calendar(currently): monthly  Current complaints: heavy clots pain cramps with periods, last 7-8 days, feels drained every month.   Current acute medical issues:  none   Recent Gynecologic History Patient's last menstrual period was 10/18/2017. Last Pap: several years,  normal Last mammogram: never,    Past Medical History:  Diagnosis Date  . Asthma   . Bronchitis     Past Surgical History:  Procedure Laterality Date  . TUBAL LIGATION    . tubes tied      OB History    Gravida  5   Para  3   Term      Preterm      AB  2   Living  3     SAB      TAB  2   Ectopic      Multiple      Live Births              Social History   Socioeconomic History  . Marital status: Single    Spouse name: Not on file  . Number of children: Not on file  . Years of education: Not on file  . Highest education level: Not on file  Occupational History  . Not on file  Social Needs  . Financial resource strain: Not on file  . Food insecurity:    Worry: Not on file    Inability: Not on file  . Transportation needs:    Medical: Not on file    Non-medical: Not on file  Tobacco Use  . Smoking status: Never Smoker  . Smokeless tobacco: Never Used  Substance and Sexual Activity  . Alcohol use: No  . Drug use: No  . Sexual activity: Yes    Birth control/protection: Surgical  Lifestyle  . Physical activity:    Days per week: Not on file    Minutes per session: Not on file  . Stress: Not on file  Relationships  . Social connections:    Talks on phone: Not on file    Gets together: Not on file    Attends religious service: Not on file    Active member of club or organization: Not on file    Attends meetings of clubs or organizations: Not on file    Relationship status: Not on  file  Other Topics Concern  . Not on file  Social History Narrative  . Not on file    Family History  Problem Relation Age of Onset  . Heart attack Paternal Grandfather   . Diabetes Paternal Grandmother   . Hypertension Mother      Current Outpatient Medications:  .  albuterol (PROVENTIL) (2.5 MG/3ML) 0.083% nebulizer solution, Take 2.5 mg by nebulization every 6 (six) hours as needed for wheezing or shortness of breath., Disp: , Rfl:  .  fluticasone furoate-vilanterol (BREO ELLIPTA) 100-25 MCG/INH AEPB, Inhale 1 puff into the lungs daily., Disp: , Rfl:  .  famotidine (PEPCID) 20 MG tablet, Take 1 tablet (20 mg total) by mouth 2 (two) times daily. (Patient not taking: Reported on 11/09/2017), Disp: 30 tablet, Rfl: 0 .  ibuprofen (ADVIL,MOTRIN) 600 MG tablet, Take 1 tablet (600 mg total) by mouth every 6 (six) hours as needed. (Patient not taking: Reported on 11/09/2017), Disp: 30 tablet, Rfl: 0 .  megestrol (MEGACE) 40 MG tablet, 3 tablets a day for 5 days, 2 tablets a day for 5 days then 1 tablet daily, Disp: 45 tablet, Rfl: 3 .  naproxen sodium (ALEVE) 220 MG tablet, Take 660 mg by mouth 2 (two) times daily as needed (Pain)., Disp: , Rfl:   Review of Systems  Review of Systems  Constitutional: Negative for fever, chills, weight loss, malaise/fatigue and diaphoresis.  HENT: Negative for hearing loss, ear pain, nosebleeds, congestion, sore throat, neck pain, tinnitus and ear discharge.   Eyes: Negative for blurred vision, double vision, photophobia, pain, discharge and redness.  Respiratory: Negative for cough, hemoptysis, sputum production, shortness of breath, wheezing and stridor.   Cardiovascular: Negative for chest pain, palpitations, orthopnea, claudication, leg swelling and PND.  Gastrointestinal: negative for abdominal pain. Negative for heartburn, nausea, vomiting, diarrhea, constipation, blood in stool and melena.  Genitourinary: Negative for dysuria, urgency, frequency,  hematuria and flank pain.  Musculoskeletal: Negative for myalgias, back pain, joint pain and falls.  Skin: Negative for itching and rash.  Neurological: Negative for dizziness, tingling, tremors, sensory change, speech change, focal weakness, seizures, loss of consciousness, weakness and headaches.  Endo/Heme/Allergies: Negative for environmental allergies and polydipsia. Does not bruise/bleed easily.  Psychiatric/Behavioral: Negative for depression, suicidal ideas, hallucinations, memory loss and substance abuse. The patient is not nervous/anxious and does not have insomnia.        Objective:  Blood pressure 118/76, pulse 79, height 5\' 4"  (1.626 m), weight 194 lb (88 kg), last menstrual period 10/18/2017, unknown if currently breastfeeding.   Physical Exam  Vitals reviewed. Constitutional: She is oriented to person, place, and time. She appears well-developed and well-nourished.  HENT:  Head: Normocephalic and atraumatic.        Right Ear: External ear normal.  Left Ear: External ear normal.  Nose: Nose normal.  Mouth/Throat: Oropharynx is clear and moist.  Eyes: Conjunctivae and EOM are normal. Pupils are equal, round, and reactive to light. Right eye exhibits no discharge. Left eye exhibits no discharge. No scleral icterus.  Neck: Normal range of motion. Neck supple. No tracheal deviation present. No thyromegaly present.  Cardiovascular: Normal rate, regular rhythm, normal heart sounds and intact distal pulses.  Exam reveals no gallop and no friction rub.   No murmur heard. Respiratory: Effort normal and breath sounds normal. No respiratory distress. She has no wheezes. She has no rales. She exhibits no tenderness.  GI: Soft. Bowel sounds are normal. She exhibits no distension and no mass. There is no tenderness. There is no rebound and no guarding.  Genitourinary:  Breasts no masses skin changes or nipple changes bilaterally      Vulva is normal without lesions Vagina is pink moist  without discharge Cervix normal in appearance and pap is done Uterus is 12 weeks size consistent with fibroids Adnexa is negative with normal sized ovaries   Musculoskeletal: Normal range of motion. She exhibits no edema and no tenderness.  Neurological: She is alert and oriented to person, place, and time. She has normal reflexes. She displays normal reflexes. No cranial nerve deficit. She exhibits normal muscle tone. Coordination normal.  Skin: Skin is warm and dry. No rash noted. No erythema. No pallor.  Psychiatric: She has a normal mood and affect. Her behavior is normal. Judgment and thought content normal.       Medications Ordered at today's visit: Meds ordered this encounter  Medications  . megestrol (MEGACE) 40 MG tablet    Sig: 3 tablets a  day for 5 days, 2 tablets a day for 5 days then 1 tablet daily    Dispense:  45 tablet    Refill:  3    Other orders placed at today's visit: Orders Placed This Encounter  Procedures  . US PELVIS TRANSVANGINAL NON-OB (TV ONLY)  . US PELVIS (TRANSABDOMINAL ONLY)      Assessment:    Healthy female exam.   12 week fibroid uterus Menorrhagia dysmenorrhea Plan:    Contraception: tubal ligation. Follow up in: 1 month. begin megestrol, sonogram 1 month     Return in about 1 month (around 12/09/2017) for GYN sono, Follow up, with Dr Elonda Husky.

## 2017-11-11 LAB — CYTOLOGY - PAP
Diagnosis: NEGATIVE
HPV: NOT DETECTED

## 2017-12-06 ENCOUNTER — Encounter: Payer: Self-pay | Admitting: Obstetrics & Gynecology

## 2017-12-16 ENCOUNTER — Ambulatory Visit (INDEPENDENT_AMBULATORY_CARE_PROVIDER_SITE_OTHER): Payer: BLUE CROSS/BLUE SHIELD

## 2017-12-16 ENCOUNTER — Ambulatory Visit: Payer: BLUE CROSS/BLUE SHIELD | Admitting: Obstetrics & Gynecology

## 2017-12-16 ENCOUNTER — Encounter: Payer: Self-pay | Admitting: Obstetrics & Gynecology

## 2017-12-16 VITALS — BP 110/60 | Ht 64.0 in | Wt 193.0 lb

## 2017-12-16 DIAGNOSIS — N920 Excessive and frequent menstruation with regular cycle: Secondary | ICD-10-CM

## 2017-12-16 DIAGNOSIS — N946 Dysmenorrhea, unspecified: Secondary | ICD-10-CM

## 2017-12-16 DIAGNOSIS — D250 Submucous leiomyoma of uterus: Secondary | ICD-10-CM | POA: Diagnosis not present

## 2017-12-16 DIAGNOSIS — F331 Major depressive disorder, recurrent, moderate: Secondary | ICD-10-CM | POA: Diagnosis not present

## 2017-12-16 DIAGNOSIS — D219 Benign neoplasm of connective and other soft tissue, unspecified: Secondary | ICD-10-CM | POA: Diagnosis not present

## 2017-12-16 MED ORDER — PAROXETINE HCL 10 MG PO TABS: 10.0000 mg | ORAL_TABLET | Freq: Every day | ORAL | 1 refills | Status: DC

## 2017-12-16 NOTE — Progress Notes (Signed)
PELVIC US TA/TV: enlarged heterogeneous anteverted uterus w/mult fibroids,(#1) anterior left subserosal fibroid 4.5 x 3.4 x 3.4 cm,(#2) anterior right intramural fibroid 2.5 x 2.6 x 2.4 cm,(#3) posterior mid uterus submucosal fibroid 3.4 x 3.2 x 2.5 cm, endometrium is distorted by submucosal fibroid ,EEC 13 mm,mult simple nabothian cyst,normal ovaries bilat,limited view of left ovary,right ovary appears mobile,no free fluid,no pain during ultrasound

## 2017-12-16 NOTE — Progress Notes (Signed)
Follow up appointment for results  Chief Complaint  Patient presents with  . Follow-up    Rachel Jefferson today   Poor response to megestrol, daily spotting  Blood pressure 110/60, height 5\' 4"  (1.626 m), weight 193 lb (87.5 kg), last menstrual period 11/15/2017, not currently breastfeeding.  Rachel Jefferson Pelvis Transvanginal Non-ob (tv Only)  Result Date: 12/16/2017 GYNECOLOGIC SONOGRAM CHALICE Rachel Jefferson is a 37 y.o. [id number] LMP 11/15/2017,she is here for a pelvic sonogram for menorrhagia,dysmenorrhea. Uterus                      12 x 7 x 8 cm, Vol 352 ml, enlarged heterogeneous anteverted uterus w/mult fibroids Endometrium          13 mm, symmetrical, heterogeneous, distorted by submucosal fibroid Right ovary             2.1 x 1.9 x 2 cm, wnl Left ovary                2.8 x 2.4 x 2.7 cm, wnl (limited view) Fibroids                   (#1) anterior left subserosal fibroid 4.5 x 3.4 x 3.4 cm,(#2) anterior right intramural fibroid 2.5 x 2.6 x 2.4 cm,(#3) posterior mid uterus submucosal fibroid 3.4 x 3.2 x 2.5 cm Technician Comments: PELVIC Rachel Jefferson TA/TV: enlarged heterogeneous anteverted uterus w/mult fibroids,(#1) anterior left subserosal fibroid 4.5 x 3.4 x 3.4 cm,(#2) anterior right intramural fibroid 2.5 x 2.6 x 2.4 cm,(#3) posterior mid uterus submucosal fibroid 3.4 x 3.2 x 2.5 cm, endometrium is distorted by submucosal fibroid ,EEC 13 mm,mult simple nabothian cyst,normal ovaries bilat,limited view of left ovary,right ovary appears mobile,no free fluid,no pain during ultrasound [employer] 12/16/2017 3:46 PM Clinical Impression and recommendations: I have reviewed the sonogram results above, combined with the patient's current clinical course, below are my impressions and any appropriate recommendations for management based on the sonographic findings. Enlarged uterus 12 week size with fibroids one of which is submucosal Endometrium is distorted by fibroids Both ovaries are normal Rachel Jefferson 12/16/2017 3:58 PM   Rachel Jefferson Pelvis  (transabdominal Only)  Result Date: 12/16/2017 GYNECOLOGIC SONOGRAM Rachel Jefferson is a 37 y.o. [id number] LMP 11/15/2017,she is here for a pelvic sonogram for menorrhagia,dysmenorrhea. Uterus                      12 x 7 x 8 cm, Vol 352 ml, enlarged heterogeneous anteverted uterus w/mult fibroids Endometrium          13 mm, symmetrical, heterogeneous, distorted by submucosal fibroid Right ovary             2.1 x 1.9 x 2 cm, wnl Left ovary                2.8 x 2.4 x 2.7 cm, wnl (limited view) Fibroids                   (#1) anterior left subserosal fibroid 4.5 x 3.4 x 3.4 cm,(#2) anterior right intramural fibroid 2.5 x 2.6 x 2.4 cm,(#3) posterior mid uterus submucosal fibroid 3.4 x 3.2 x 2.5 cm Technician Comments: PELVIC Rachel Jefferson TA/TV: enlarged heterogeneous anteverted uterus w/mult fibroids,(#1) anterior left subserosal fibroid 4.5 x 3.4 x 3.4 cm,(#2) anterior right intramural fibroid 2.5 x 2.6 x 2.4 cm,(#3) posterior mid uterus submucosal fibroid 3.4 x 3.2 x 2.5 cm, endometrium is distorted by submucosal fibroid ,EEC  13 mm,mult simple nabothian cyst,normal ovaries bilat,limited view of left ovary,right ovary appears mobile,no free fluid,no pain during ultrasound [employer] 12/16/2017 3:46 PM Clinical Impression and recommendations: I have reviewed the sonogram results above, combined with the patient's current clinical course, below are my impressions and any appropriate recommendations for management based on the sonographic findings. Enlarged uterus 12 week size with fibroids one of which is submucosal Endometrium is distorted by fibroids Both ovaries are normal Rachel Jefferson 12/16/2017 3:58 PM      MEDS ordered this encounter: Meds ordered this encounter  Medications  . PARoxetine (PAXIL) 10 MG tablet    Sig: Take 1 tablet (10 mg total) by mouth daily.    Dispense:  30 tablet    Refill:  1    Orders for this encounter: No orders of the defined types were placed in this  encounter.   Impression: Menorrhagia with regular cycle  Dysmenorrhea  Fibroids, submucosal  Fibroids  Moderate episode of recurrent major depressive disorder (HCC)     Plan: Abdominal supracervical hysterectomy with removal of both Fallopian tubes, preserve ovaries  02/09/2018   Meds ordered this encounter  Medications  . PARoxetine (PAXIL) 10 MG tablet    Sig: Take 1 tablet (10 mg total) by mouth daily.    Dispense:  30 tablet    Refill:  1      Follow Up: Return in about 1 month (around 01/16/2018) for Follow up, with Dr Elonda Husky: depression.       Face to face time:  15 minutes  Greater than 50% of the visit time was spent in counseling and coordination of care with the patient.  The summary and outline of the counseling and care coordination is summarized in the note above.   All questions were answered.  Past Medical History:  Diagnosis Date  . Asthma   . Bronchitis     Past Surgical History:  Procedure Laterality Date  . TUBAL LIGATION    . tubes tied      OB History    Gravida  5   Para  3   Term      Preterm      AB  2   Living  3     SAB      TAB  2   Ectopic      Multiple      Live Births              Allergies  Allergen Reactions  . Penicillins Hives and Swelling    Has patient had a PCN reaction causing immediate rash, facial/tongue/throat swelling, SOB or lightheadedness with hypotension: Yes Has patient had a PCN reaction causing severe rash involving mucus membranes or skin necrosis: No Has patient had a PCN reaction that required hospitalization: unknown Has patient had a PCN reaction occurring within the last 10 years:No If all of the above answers are "NO", then may proceed with Cephalosporin use.     Social History   Socioeconomic History  . Marital status: Single    Spouse name: Not on file  . Number of children: Not on file  . Years of education: Not on file  . Highest education level: Not on file   Occupational History  . Not on file  Social Needs  . Financial resource strain: Not on file  . Food insecurity:    Worry: Not on file    Inability: Not on file  . Transportation needs:  Medical: Not on file    Non-medical: Not on file  Tobacco Use  . Smoking status: Never Smoker  . Smokeless tobacco: Never Used  Substance and Sexual Activity  . Alcohol use: No  . Drug use: No  . Sexual activity: Yes    Birth control/protection: Surgical  Lifestyle  . Physical activity:    Days per week: Not on file    Minutes per session: Not on file  . Stress: Not on file  Relationships  . Social connections:    Talks on phone: Not on file    Gets together: Not on file    Attends religious service: Not on file    Active member of club or organization: Not on file    Attends meetings of clubs or organizations: Not on file    Relationship status: Not on file  Other Topics Concern  . Not on file  Social History Narrative  . Not on file    Family History  Problem Relation Age of Onset  . Heart attack Paternal Grandfather   . Diabetes Paternal Grandmother   . Hypertension Mother

## 2018-01-03 ENCOUNTER — Telehealth: Payer: Self-pay | Admitting: *Deleted

## 2018-01-03 NOTE — Telephone Encounter (Signed)
Patient states she is having heavy bleeding with clots.  Stopped taking the Megace because it was not working.  Informed patient that there was nothing else at this time that could be done or given to her until she has her surgery.  Advised she could start the Megace back but if it didn't help before then it probably wouldn't now.  Informed we could try to move her surgery up to a sooner date if she desired but patient stated she wanted to leave it for now.

## 2018-01-13 ENCOUNTER — Encounter: Payer: Self-pay | Admitting: Obstetrics & Gynecology

## 2018-01-13 ENCOUNTER — Ambulatory Visit: Payer: BLUE CROSS/BLUE SHIELD | Admitting: Obstetrics & Gynecology

## 2018-01-13 VITALS — BP 123/80 | HR 88 | Ht 64.0 in | Wt 197.0 lb

## 2018-01-13 DIAGNOSIS — F5101 Primary insomnia: Secondary | ICD-10-CM

## 2018-01-13 DIAGNOSIS — F331 Major depressive disorder, recurrent, moderate: Secondary | ICD-10-CM

## 2018-01-13 MED ORDER — ZOLPIDEM TARTRATE 10 MG PO TABS: 10.0000 mg | ORAL_TABLET | Freq: Every evening | ORAL | 3 refills | Status: DC | PRN

## 2018-01-13 MED ORDER — PAROXETINE HCL 20 MG PO TABS: 20.0000 mg | ORAL_TABLET | Freq: Every day | ORAL | 1 refills | Status: DC

## 2018-01-13 NOTE — Progress Notes (Signed)
Subjective:     Rachel Jefferson is a 37 y.o. female who presents for follow up of depression. Current symptoms include anhedonia, depressed mood, difficulty concentrating and insomnia. Symptoms have been minimally improved since that time. Patient denies suicidal attempt and suicidal thoughts without plan. Previous treatment includes: none. She complains of the following side effects from the treatment: none.  The following portions of the patient's history were reviewed and updated as appropriate: allergies, current medications, past family history, past medical history, past social history, past surgical history and problem list.  Review of Systems Pertinent items are noted in HPI.    Objective:    BP 123/80 (BP Location: Left Arm, Patient Position: Sitting, Cuff Size: Normal)   Pulse 88   Ht 5\' 4"  (1.626 m)   Wt 197 lb (89.4 kg)   BMI 33.81 kg/m   General:  alert, cooperative and icteric  Affect & Behavior:  full facial expressions, good grooming, good insight, normal perception, normal reasoning, normal speech pattern and content and normal thought patterns       Assessment:    Depression, unchanged      Plan:    Medications: Paxil and ambien. Follow up: 6 weeks. Spent 15 minutes (>50% of visit) discussing the risks of anxiety disorder, the  pathophysiology, etiology, risks, and principles of treatment.    Meds ordered this encounter  Medications  . PARoxetine (PAXIL) 20 MG tablet    Sig: Take 1 tablet (20 mg total) by mouth daily.    Dispense:  30 tablet    Refill:  1  . zolpidem (AMBIEN) 10 MG tablet    Sig: Take 1 tablet (10 mg total) by mouth at bedtime as needed for sleep.    Dispense:  30 tablet    Refill:  3

## 2018-02-01 NOTE — Patient Instructions (Addendum)
Rachel Jefferson  02/01/2018     @PREFPERIOPPHARMACY @   Your procedure is scheduled on  02/09/2018 .  Report to [facility] at  840   A.M.  Call this number if you have problems the morning of surgery:  [phone]   Remember:  Do not eat or drink after midnight.  You may drink clear liquids until 12 midnight 02/08/2018 .  Clear liquids allowed are:                    Water, Juice (non-citric and without pulp), Carbonated beverages, Clear Tea, Black Coffee only, Plain Jell-O only, Gatorade and Plain Popsicles only    Take these medicines the morning of surgery with A SIP OF WATER  paxil.    Do not wear jewelry, make-up or nail polish.  Do not wear lotions, powders, or perfumes, or deodorant.  Do not shave 48 hours prior to surgery.  Men may shave face and neck.  Do not bring valuables to the hospital.  [facility] is not responsible for any belongings or valuables.  Contacts, dentures or bridgework may not be worn into surgery.  Leave your suitcase in the car.  After surgery it may be brought to your room.  For patients admitted to the hospital, discharge time will be determined by your treatment team.  Patients discharged the day of surgery will not be allowed to drive home.   Name and phone number of your driver:   family Special instructions:  Paxil.  Please read over the following fact sheets that you were given. Pain Booklet, Coughing and Deep Breathing, Blood Transfusion Information, Lab Information, Surgical Site Infection Prevention, Anesthesia Post-op Instructions and Care and Recovery After Surgery       Salpingectomy Salpingectomy, also called tubectomy, is the surgical removal of one of the fallopian tubes. The fallopian tubes are where eggs travel from the ovaries to the uterus. Removing one fallopian tube does not prevent you from becoming pregnant. It also does not cause problems with your menstrual periods. You may need a salpingectomy  if you:  Have a fertilized egg that attaches to the fallopian tube (ectopic pregnancy), especially one that causes the tube to burst or tear (rupture).  Have an infected fallopian tube.  Have cancer of the fallopian tube or nearby organs.  Have had an ovary removed due to a cyst or tumor.  Have had your uterus removed.  There are three different methods that can be used for a salpingectomy:  Open. This method involves making one large incision in your abdomen.  Laparoscopic. This method involves using a thin, lighted tube with a tiny camera on the end (laparoscope) to help perform the procedure. The laparoscope will allow your surgeon to make several small incisions in the abdomen instead of a large incision.  Robot-assisted: This method involves using a computer to control surgical instruments that are attached to robotic arms.  Tell a health care provider about:  Any allergies you have.  All medicines you are taking, including vitamins, herbs, eye drops, creams, and over-the-counter medicines.  Any problems you or family members have had with anesthetic medicines.  Any blood disorders you have.  Any surgeries you have had.  Any medical conditions you have.  Whether you are pregnant or may be pregnant. What are the risks? Generally, this is a safe procedure. However, problems may occur, including:  Infection.  Bleeding.  Allergic reactions to medicines.  Damage to other structures or organs.  Blood clots in the legs or lungs.  What happens before the procedure? Staying hydrated Follow instructions from your health care provider about hydration, which may include:  Up to 2 hours before the procedure - you may continue to drink clear liquids, such as water, clear fruit juice, black coffee, and plain tea.  Eating and drinking restrictions Follow instructions from your health care provider about eating and drinking, which may include:  8 hours before the  procedure - stop eating heavy meals or foods such as meat, fried foods, or fatty foods.  6 hours before the procedure - stop eating light meals or foods, such as toast or cereal.  6 hours before the procedure - stop drinking milk or drinks that contain milk.  2 hours before the procedure - stop drinking clear liquids.  Medicines  Ask your health care provider about: ? Changing or stopping your regular medicines. This is especially important if you are taking diabetes medicines or blood thinners. ? Taking medicines such as aspirin and ibuprofen. These medicines can thin your blood. Do not take these medicines before your procedure if your health care provider instructs you not to.  You may be given antibiotic medicine to help prevent infection. General instructions  Do not smoke for at least 2 weeks before your procedure. If you need help quitting, ask your health care provider.  You may have an exam or tests, such as an electrocardiogram (ECG).  You may have a blood or urine sample taken.  Ask your health care provider: ? Whether you should stop removing hair from your surgical area. ? How your surgical site will be marked or identified.  You may be asked to shower with a germ-killing soap.  Plan to have someone take you home from the hospital or clinic.  If you will be going home right after the procedure, plan to have someone with you for 24 hours. What happens during the procedure?  To reduce your risk of infection: ? Your health care team will wash or sanitize their hands. ? Hair may be removed from the surgical area. ? Your skin will be washed with soap.  An IV tube will be inserted into one of your veins.  You will be given a medicine to make you fall asleep (general anesthetic). You may also be given a medicine to help you relax (sedative).  A thin tube (catheter) may be inserted through your urethra and into your bladder to drain urine during your  procedure.  Depending on the type of procedure you are having, one incision or several small incisions will be made in your abdomen.  Your fallopian tube will be cut and removed from where it attaches to your uterus.  Your blood vessels will be clamped and tied to prevent excess bleeding.  The incision(s) in your abdomen will be closed with stitches (sutures), staples, or skin glue.  A bandage (dressing) may be placed over your incision(s). The procedure may vary among health care providers and hospitals. What happens after the procedure?  Your blood pressure, heart rate, breathing rate, and blood oxygen level will be monitored until the medicines you were given have worn off.  You may continue to receive fluids and medicines through an IV tube.  You may continue to have a catheter draining your urine.  You may have to wear compression stockings. These stockings help to prevent blood clots and reduce swelling in your  legs.  You will be given pain medicine as needed.  Do not drive for 24 hours if you received a sedative. Summary  Salpingectomy is a surgical procedure to remove one of the fallopian tubes.  The procedure may be done with an open incision, with a laparoscope, or with computer-controlled instruments.  Depending on the type of procedure you are having, one incision or several small incisions will be made in your abdomen.  Your blood pressure, heart rate, breathing rate, and blood oxygen level will be monitored until the medicines you were given have worn off.  Plan to have someone take you home from the hospital or clinic. This information is not intended to replace advice given to you by your health care provider. Make sure you discuss any questions you have with your health care provider. Document Released: 12/06/2008 Document Revised: 03/06/2016 Document Reviewed: 01/11/2013 Elsevier Interactive Patient Education  2018 [place] Hysterectomy A  supracervical hysterectomy is surgery to remove the top part of the uterus, but not the cervix. You will no longer have menstrual periods or be able to get pregnant after this surgery. The fallopian tubes and ovaries may also be removed (bilateral salpingo-oophorectomy) during this surgery. This surgery is usually performed using a minimally invasive technique called laparoscopy. This technique allows the surgery to be done through small incisions. The minimally invasive technique provides benefits such as less pain, less risk of infection, and shorter recovery time. Tell a health care provider about:  Any allergies you have.  All medicines you are taking, including vitamins, herbs, eye drops, creams, and over-the-counter medicines.  Any problems you or family members have had with anesthetic medicines.  Any blood disorders you have.  Any surgeries you have had.  Any medical conditions you have. What are the risks? Generally, this is a safe procedure. However, as with any procedure, complications can occur. Possible complications include:  Bleeding.  Blood clots in the legs or lung.  Infection.  Injury to surrounding organs.  Problems related to anesthesia.  Conversion to an open abdominal surgery.  Additional surgery later to remove the cervix if you have problems with the cervix.  What happens before the procedure?  Ask your health care provider about changing or stopping your regular medicines.  Do not take aspirin or blood thinners (anticoagulants) for 1 week before the surgery, or as directed by your health care provider.  Do not eat or drink anything for 8 hours before the surgery, or as directed by your health care provider.  Quit smoking if you smoke.  Arrange for a ride home after surgery and for someone to help you at home during recovery. What happens during the procedure?  You will be given an antibiotic medicine.  An IV tube will be placed in one of your  veins. You will be given medicine to make you sleep (general anesthetic).  A gas (carbon dioxide) will be used to inflate your abdomen. This will allow your surgeon to look inside your abdomen, perform your surgery, and treat any other problems found if necessary.  Three or four small incisions will be made in your abdomen. One of these incisions will be made in the area of your belly button (navel). A thin, flexible tube with a tiny camera and light on the end of it (laparoscope) will be inserted into the incision. The camera on the laparoscope sends a picture to a TV screen in the operating room. This gives your surgeon a good view inside  the abdomen.  Other surgical instruments will be inserted through the other incisions.  The uterus will be cut into small pieces and removed through the small incisions.  Your incisions will be closed. What happens after the procedure?  You will be taken to a recovery area where your progress will be monitored until you are awake, stable, and taking fluids well. If there are no other problems, you will then be moved to a regular hospital room, or you will be allowed to go home.  You will likely have minimal discomfort after the surgery because the incisions are so small with the laparoscopic technique.  You will be given pain medicine while you are in the hospital and for when you go home.  If a bilateral salpingo-oophorectomy was performed before menopause, you will go through a sudden (abrupt) menopause. This can be helped with hormone medicines. This information is not intended to replace advice given to you by your health care provider. Make sure you discuss any questions you have with your health care provider. Document Released: 01/06/2008 Document Revised: 12/26/2015 Document Reviewed: 01/20/2013 Elsevier Interactive Patient Education  2018 [place] After This sheet gives you information about how to care for yourself  after your procedure. Your health care provider may also give you more specific instructions. If you have problems or questions, contact your health care provider. What can I expect after the procedure? After your procedure, it is common to have:  Pain in your abdomen.  Some occasional vaginal bleeding (spotting).  Tiredness.  Follow these instructions at home: Incision care   Keep your incision area and your bandage (dressing) clean and dry.  Follow instructions from your health care provider about how to take care of your incision. Make sure you: ? Wash your hands with soap and water before you change your dressing. If soap and water are not available, use hand sanitizer. ? Change your dressing astold by your health care provider. ? Leave stitches (sutures), staples, skin glue, or adhesive strips in place. These skin closures may need to stay in place for 2 weeks or longer. If adhesive strip edges start to loosen and curl up, you may trim the loose edges. Do not remove adhesive strips completely unless your health care provider tells you to do that.  Check your incision area every day for signs of infection. Check for: ? More redness, swelling, or pain. ? More fluid or blood. ? Warmth. ? Pus or a bad smell. Activity   Do not drive or use heavy machinery while taking prescription pain medicine.  Do not drive for 24 hours if you received a medicine to help you relax (sedative).  Rest as directed by your health care provider. Ask your health care provider what activities are safe for you. You should avoid: ? Lifting anything that is heavier than 10 lb (4.5 kg) until your health care provider approves. ? Activities that require a lot of energy.  Until your health care provider approves: ? Do not douche. ? Do not use tampons. ? Do not have sexual intercourse. General instructions  Take over-the-counter and prescription medicines only as told by your health care  provider.  To prevent or treat constipation while you are taking prescription pain medicine, your health care provider may recommend that you: ? Drink enough fluid to keep your urine clear or pale yellow. ? Take over-the-counter or prescription medicines. ? Eat foods that are high in fiber, such as fresh fruits and vegetables,  whole grains, and beans. ? Limit foods that are high in fat and processed sugars, such as fried and sweet foods.  Do not take baths, swim, or use a hot tub until your health care provider approves. You may take showers.  Wear compression stockings as told by your health care provider. These stockings help to prevent blood clots and reduce swelling in your legs.  Keep all follow-up visits as told by your health care provider. This is important. Contact a health care provider if:  You have: ? Pain when you urinate. ? More redness, swelling, or pain around your incision. ? More fluid or blood coming from your incision. ? Pus or a bad smell coming from your incision. ? A fever. ? Abdominal pain that gets worse or does not get better with medicine.  Your incision feels warm to the touch.  Your incision starts to break open.  You develop a rash.  You develop nausea and vomiting.  You feel light-headed. Get help right away if:  You develop pain in your chest or leg.  You develop shortness of breath.  You faint.  You have increased vaginal bleeding. This information is not intended to replace advice given to you by your health care provider. Make sure you discuss any questions you have with your health care provider. Document Released: 10/24/2010 Document Revised: 03/18/2016 Document Reviewed: 03/19/2016 Elsevier Interactive Patient Education  2018 [place] Hysterectomy, Care After Refer to this sheet in the next few weeks. These instructions provide you with information on caring for yourself after your procedure. Your health care  provider may also give you more specific instructions. Your treatment has been planned according to current medical practices, but problems sometimes occur. Call your health care provider if you have any problems or questions after your procedure. What can I expect after the procedure? After your procedure, it is typical to have some discomfort, tenderness, swelling, and bruising at the surgical sites. This normally lasts for about 2 weeks. Follow these instructions at home:  Get plenty of rest and sleep.  Only take over-the-counter or prescription medicines as directed by your health care provider.  Do not take aspirin. It can cause bleeding.  Do not drive until your health care provider approves.  Follow your health care provider's advice regarding exercise, lifting, and general activities.  Resume your usual diet as directed by your health care provider.  Do not douche, use tampons, or have sexual intercourse for at least 6 weeks or until your health care provider gives you permission.  Change your bandages (dressings) only as directed by your health care provider.  Monitor your temperature.  Take showers instead of baths for 2-3 weeks or as directed by your health care provider.  Drink enough fluids to keep your urine clear or pale yellow.  Do not drink alcohol until your health care provider gives you permission.  If you are constipated, you may take a mild laxative if your health care provider approves. Bran foods may also help with constipation problems.  Try to have someone home with you for 1-2 weeks to help with activities.  Follow up with your health care provider as directed. Contact a health care provider if:  You have swelling, redness, or increasing pain in the incision area.  You have pus coming from an incision.  You notice a bad smell coming from the incision or dressing.  You have swelling, redness, or pain in the area around the IV site.  Your incision  breaks open.  You feel dizzy or lightheaded.  You have pain or bleeding when you urinate.  You have persistent diarrhea.  You have persistent nausea and vomiting.  You have abnormal vaginal discharge.  You have a rash.  Your pain is not controlled with your prescribed medicine. Get help right away if:  You have a fever.  You have severe abdominal pain.  You have chest pain.  You have shortness of breath.  You faint.  You have pain, swelling, or redness in your leg.  You have heavy vaginal bleeding with blood clots. This information is not intended to replace advice given to you by your health care provider. Make sure you discuss any questions you have with your health care provider. Document Released: 05/10/2013 Document Revised: 12/26/2015 Document Reviewed: 01/20/2013 Elsevier Interactive Patient Education  2018 [place] Anesthesia, Adult General anesthesia is the use of medicines to make a person "go to sleep" (be unconscious) for a medical procedure. General anesthesia is often recommended when a procedure:  Is long.  Requires you to be still or in an unusual position.  Is major and can cause you to lose blood.  Is impossible to do without general anesthesia.  The medicines used for general anesthesia are called general anesthetics. In addition to making you sleep, the medicines:  Prevent pain.  Control your blood pressure.  Relax your muscles.  Tell a health care provider about:  Any allergies you have.  All medicines you are taking, including vitamins, herbs, eye drops, creams, and over-the-counter medicines.  Any problems you or family members have had with anesthetic medicines.  Types of anesthetics you have had in the past.  Any bleeding disorders you have.  Any surgeries you have had.  Any medical conditions you have.  Any history of heart or lung conditions, such as heart failure, sleep apnea, or chronic obstructive  pulmonary disease (COPD).  Whether you are pregnant or may be pregnant.  Whether you use tobacco, alcohol, marijuana, or street drugs.  Any history of Armed forces logistics/support/administrative officer.  Any history of depression or anxiety. What are the risks? Generally, this is a safe procedure. However, problems may occur, including:  Allergic reaction to anesthetics.  Lung and heart problems.  Inhaling food or liquids from your stomach into your lungs (aspiration).  Injury to nerves.  Waking up during your procedure and being unable to move (rare).  Extreme agitation or a state of mental confusion (delirium) when you wake up from the anesthetic.  Air in the bloodstream, which can lead to stroke.  These problems are more likely to develop if you are having a major surgery or if you have an advanced medical condition. You can prevent some of these complications by answering all of your health care provider's questions thoroughly and by following all pre-procedure instructions. General anesthesia can cause side effects, including:  Nausea or vomiting  A sore throat from the breathing tube.  Feeling cold or shivery.  Feeling tired, washed out, or achy.  Sleepiness or drowsiness.  Confusion or agitation.  What happens before the procedure? Staying hydrated Follow instructions from your health care provider about hydration, which may include:  Up to 2 hours before the procedure - you may continue to drink clear liquids, such as water, clear fruit juice, black coffee, and plain tea.  Eating and drinking restrictions Follow instructions from your health care provider about eating and drinking, which may include:  8 hours before the procedure -  stop eating heavy meals or foods such as meat, fried foods, or fatty foods.  6 hours before the procedure - stop eating light meals or foods, such as toast or cereal.  6 hours before the procedure - stop drinking milk or drinks that contain milk.  2 hours  before the procedure - stop drinking clear liquids.  Medicines  Ask your health care provider about: ? Changing or stopping your regular medicines. This is especially important if you are taking diabetes medicines or blood thinners. ? Taking medicines such as aspirin and ibuprofen. These medicines can thin your blood. Do not take these medicines before your procedure if your health care provider instructs you not to. ? Taking new dietary supplements or medicines. Do not take these during the week before your procedure unless your health care provider approves them.  If you are told to take a medicine or to continue taking a medicine on the day of the procedure, take the medicine with sips of water. General instructions   Ask if you will be going home the same day, the following day, or after a longer hospital stay. ? Plan to have someone take you home. ? Plan to have someone stay with you for the first 24 hours after you leave the hospital or clinic.  For 3-6 weeks before the procedure, try not to use any tobacco products, such as cigarettes, chewing tobacco, and e-cigarettes.  You may brush your teeth on the morning of the procedure, but make sure to spit out the toothpaste. What happens during the procedure?  You will be given anesthetics through a mask and through an IV tube in one of your veins.  You may receive medicine to help you relax (sedative).  As soon as you are asleep, a breathing tube may be used to help you breathe.  An anesthesia specialist will stay with you throughout the procedure. He or she will help keep you comfortable and safe by continuing to give you medicines and adjusting the amount of medicine that you get. He or she will also watch your blood pressure, pulse, and oxygen levels to make sure that the anesthetics do not cause any problems.  If a breathing tube was used to help you breathe, it will be removed before you wake up. The procedure may vary among  health care providers and hospitals. What happens after the procedure?  You will wake up, often slowly, after the procedure is complete, usually in a recovery area.  Your blood pressure, heart rate, breathing rate, and blood oxygen level will be monitored until the medicines you were given have worn off.  You may be given medicine to help you calm down if you feel anxious or agitated.  If you will be going home the same day, your health care provider may check to make sure you can stand, drink, and urinate.  Your health care providers will treat your pain and side effects before you go home.  Do not drive for 24 hours if you received a sedative.  You may: ? Feel nauseous and vomit. ? Have a sore throat. ? Have mental slowness. ? Feel cold or shivery. ? Feel sleepy. ? Feel tired. ? Feel sore or achy, even in parts of your body where you did not have surgery. This information is not intended to replace advice given to you by your health care provider. Make sure you discuss any questions you have with your health care provider. Document Released: 10/27/2007 Document Revised: 12/31/2015  Document Reviewed: 07/04/2015 Elsevier Interactive Patient Education  2018 [place] Anesthesia, Adult, Care After These instructions provide you with information about caring for yourself after your procedure. Your health care provider may also give you more specific instructions. Your treatment has been planned according to current medical practices, but problems sometimes occur. Call your health care provider if you have any problems or questions after your procedure. What can I expect after the procedure? After the procedure, it is common to have:  Vomiting.  A sore throat.  Mental slowness.  It is common to feel:  Nauseous.  Cold or shivery.  Sleepy.  Tired.  Sore or achy, even in parts of your body where you did not have surgery.  Follow these instructions at home: For  at least 24 hours after the procedure:  Do not: ? Participate in activities where you could fall or become injured. ? Drive. ? Use heavy machinery. ? Drink alcohol. ? Take sleeping pills or medicines that cause drowsiness. ? Make important decisions or sign legal documents. ? Take care of children on your own.  Rest. Eating and drinking  If you vomit, drink water, juice, or soup when you can drink without vomiting.  Drink enough fluid to keep your urine clear or pale yellow.  Make sure you have little or no nausea before eating solid foods.  Follow the diet recommended by your health care provider. General instructions  Have a responsible adult stay with you until you are awake and alert.  Return to your normal activities as told by your health care provider. Ask your health care provider what activities are safe for you.  Take over-the-counter and prescription medicines only as told by your health care provider.  If you smoke, do not smoke without supervision.  Keep all follow-up visits as told by your health care provider. This is important. Contact a health care provider if:  You continue to have nausea or vomiting at home, and medicines are not helpful.  You cannot drink fluids or start eating again.  You cannot urinate after 8-12 hours.  You develop a skin rash.  You have fever.  You have increasing redness at the site of your procedure. Get help right away if:  You have difficulty breathing.  You have chest pain.  You have unexpected bleeding.  You feel that you are having a life-threatening or urgent problem. This information is not intended to replace advice given to you by your health care provider. Make sure you discuss any questions you have with your health care provider. Document Released: 10/26/2000 Document Revised: 12/23/2015 Document Reviewed: 07/04/2015 Elsevier Interactive Patient Education  [employer].

## 2018-02-04 ENCOUNTER — Other Ambulatory Visit (HOSPITAL_COMMUNITY): Payer: BLUE CROSS/BLUE SHIELD

## 2018-02-07 ENCOUNTER — Other Ambulatory Visit: Payer: Self-pay

## 2018-02-07 ENCOUNTER — Encounter (HOSPITAL_COMMUNITY)
Admission: RE | Admit: 2018-02-07 | Discharge: 2018-02-07 | Disposition: A | Discharge status: Home / Self Care | Payer: BLUE CROSS/BLUE SHIELD | Source: Ambulatory Visit | Attending: Obstetrics & Gynecology | Admitting: Obstetrics & Gynecology

## 2018-02-07 ENCOUNTER — Encounter (HOSPITAL_COMMUNITY): Payer: Self-pay

## 2018-02-07 ENCOUNTER — Other Ambulatory Visit: Payer: Self-pay | Admitting: Obstetrics & Gynecology

## 2018-02-07 DIAGNOSIS — Z01812 Encounter for preprocedural laboratory examination: Secondary | ICD-10-CM | POA: Diagnosis not present

## 2018-02-07 HISTORY — DX: Gastro-esophageal reflux disease without esophagitis: K21.9

## 2018-02-07 HISTORY — DX: Dyspnea, unspecified: R06.00

## 2018-02-07 HISTORY — DX: Anxiety disorder, unspecified: F41.9

## 2018-02-07 LAB — COMPREHENSIVE METABOLIC PANEL
ALT: 14 U/L (ref 0–44)
AST: 15 U/L (ref 15–41)
Albumin: 3.7 g/dL (ref 3.5–5.0)
Alkaline Phosphatase: 70 U/L (ref 38–126)
Anion gap: 9 (ref 5–15)
BUN: 15 mg/dL (ref 6–20)
CHLORIDE: 109 mmol/L (ref 98–111)
CO2: 20 mmol/L — AB (ref 22–32)
CREATININE: 0.71 mg/dL (ref 0.44–1.00)
Calcium: 8.9 mg/dL (ref 8.9–10.3)
GFR calc Af Amer: >60 mL/min (ref >60)
Glucose, Bld: 94 mg/dL (ref 70–99)
Potassium: 3.7 mmol/L (ref 3.5–5.1)
Sodium: 138 mmol/L (ref 135–145)
Total Bilirubin: 0.5 mg/dL (ref 0.3–1.2)
Total Protein: 7.7 g/dL (ref 6.5–8.1)

## 2018-02-07 LAB — CBC
HEMATOCRIT: 39.4 % (ref 36.0–46.0)
HEMOGLOBIN: 12.6 g/dL (ref 12.0–15.0)
MCH: 25.8 pg — ABNORMAL LOW (ref 26.0–34.0)
MCHC: 32 g/dL (ref 30.0–36.0)
MCV: 80.6 fL (ref 78.0–100.0)
Platelets: 324 10*3/uL (ref 150–400)
RBC: 4.89 MIL/uL (ref 3.87–5.11)
RDW: 14.9 % (ref 11.5–15.5)
WBC: 9.3 10*3/uL (ref 4.0–10.5)

## 2018-02-07 LAB — URINALYSIS, ROUTINE W REFLEX MICROSCOPIC
BACTERIA UA: NONE SEEN
Bilirubin Urine: NEGATIVE
Glucose, UA: NEGATIVE mg/dL
Ketones, ur: NEGATIVE mg/dL
Leukocytes, UA: NEGATIVE
Nitrite: NEGATIVE
PH: 5 (ref 5.0–8.0)
Protein, ur: NEGATIVE mg/dL
SPECIFIC GRAVITY, URINE: 1.024 (ref 1.005–1.030)

## 2018-02-07 LAB — RAPID HIV SCREEN (HIV 1/2 AB+AG)
HIV 1/2 Antibodies: NONREACTIVE
HIV-1 P24 Antigen - HIV24: NONREACTIVE

## 2018-02-07 LAB — HCG, QUANTITATIVE, PREGNANCY

## 2018-02-09 ENCOUNTER — Encounter (HOSPITAL_COMMUNITY)
Admission: RE | Disposition: A | Discharge status: Home / Self Care | Payer: Self-pay | Source: Ambulatory Visit | Attending: Obstetrics & Gynecology

## 2018-02-09 ENCOUNTER — Inpatient Hospital Stay (HOSPITAL_COMMUNITY)
Admission: RE | Admit: 2018-02-09 | Discharge: 2018-02-10 | DRG: 742 | Disposition: A | Discharge status: Home / Self Care | Payer: BLUE CROSS/BLUE SHIELD | Source: Ambulatory Visit | Attending: Obstetrics & Gynecology | Admitting: Obstetrics & Gynecology

## 2018-02-09 ENCOUNTER — Other Ambulatory Visit: Payer: Self-pay

## 2018-02-09 ENCOUNTER — Inpatient Hospital Stay (HOSPITAL_COMMUNITY): Payer: BLUE CROSS/BLUE SHIELD | Admitting: Anesthesiology

## 2018-02-09 ENCOUNTER — Encounter (HOSPITAL_COMMUNITY): Payer: Self-pay

## 2018-02-09 DIAGNOSIS — Z90711 Acquired absence of uterus with remaining cervical stump: Secondary | ICD-10-CM | POA: Diagnosis present

## 2018-02-09 DIAGNOSIS — D259 Leiomyoma of uterus, unspecified: Secondary | ICD-10-CM | POA: Diagnosis not present

## 2018-02-09 DIAGNOSIS — D252 Subserosal leiomyoma of uterus: Secondary | ICD-10-CM | POA: Diagnosis not present

## 2018-02-09 DIAGNOSIS — N854 Malposition of uterus: Secondary | ICD-10-CM | POA: Diagnosis present

## 2018-02-09 DIAGNOSIS — N946 Dysmenorrhea, unspecified: Secondary | ICD-10-CM | POA: Diagnosis not present

## 2018-02-09 DIAGNOSIS — Z833 Family history of diabetes mellitus: Secondary | ICD-10-CM | POA: Diagnosis not present

## 2018-02-09 DIAGNOSIS — K219 Gastro-esophageal reflux disease without esophagitis: Secondary | ICD-10-CM | POA: Diagnosis not present

## 2018-02-09 DIAGNOSIS — Z8249 Family history of ischemic heart disease and other diseases of the circulatory system: Secondary | ICD-10-CM | POA: Diagnosis not present

## 2018-02-09 DIAGNOSIS — N920 Excessive and frequent menstruation with regular cycle: Secondary | ICD-10-CM | POA: Diagnosis not present

## 2018-02-09 DIAGNOSIS — Z880 Allergy status to penicillin: Secondary | ICD-10-CM

## 2018-02-09 DIAGNOSIS — F331 Major depressive disorder, recurrent, moderate: Secondary | ICD-10-CM | POA: Diagnosis not present

## 2018-02-09 DIAGNOSIS — D250 Submucous leiomyoma of uterus: Secondary | ICD-10-CM | POA: Diagnosis not present

## 2018-02-09 DIAGNOSIS — D251 Intramural leiomyoma of uterus: Secondary | ICD-10-CM | POA: Diagnosis not present

## 2018-02-09 DIAGNOSIS — N921 Excessive and frequent menstruation with irregular cycle: Secondary | ICD-10-CM | POA: Diagnosis not present

## 2018-02-09 HISTORY — PX: SUPRACERVICAL ABDOMINAL HYSTERECTOMY: SHX5393

## 2018-02-09 HISTORY — PX: BILATERAL SALPINGECTOMY: SHX5743

## 2018-02-09 SURGERY — HYSTERECTOMY, SUPRACERVICAL, ABDOMINAL
Anesthesia: General | Site: Abdomen

## 2018-02-09 MED ORDER — HYDROCODONE-ACETAMINOPHEN 7.5-325 MG PO TABS: 1.0000 | ORAL_TABLET | Freq: Once | ORAL | Status: DC | PRN

## 2018-02-09 MED ORDER — ROCURONIUM BROMIDE 10 MG/ML (PF) SYRINGE
PREFILLED_SYRINGE | INTRAVENOUS | Status: DC | PRN
  Administered 2018-02-09: 50 mg via INTRAVENOUS
  Administered 2018-02-09: 20 mg via INTRAVENOUS

## 2018-02-09 MED ORDER — HYDROMORPHONE HCL 1 MG/ML IJ SOLN
1.0000 mg | INTRAMUSCULAR | Status: DC | PRN
  Administered 2018-02-09 (×2): 2 mg via INTRAVENOUS
  Filled 2018-02-09 (×2): qty 2

## 2018-02-09 MED ORDER — LIDOCAINE 2% (20 MG/ML) 5 ML SYRINGE
INTRAMUSCULAR | Status: DC | PRN
  Administered 2018-02-09: 100 mg via INTRAVENOUS

## 2018-02-09 MED ORDER — HEMOSTATIC AGENTS (NO CHARGE) OPTIME
TOPICAL | Status: DC | PRN
  Administered 2018-02-09 (×2): 1 via TOPICAL

## 2018-02-09 MED ORDER — KETOROLAC TROMETHAMINE 30 MG/ML IJ SOLN
30.0000 mg | Freq: Four times a day (QID) | INTRAMUSCULAR | Status: AC
  Administered 2018-02-09 – 2018-02-10 (×3): 30 mg via INTRAVENOUS
  Filled 2018-02-09 (×3): qty 1

## 2018-02-09 MED ORDER — DOCUSATE SODIUM 100 MG PO CAPS
100.0000 mg | ORAL_CAPSULE | Freq: Two times a day (BID) | ORAL | Status: DC
  Administered 2018-02-09 – 2018-02-10 (×2): 100 mg via ORAL
  Filled 2018-02-09 (×2): qty 1

## 2018-02-09 MED ORDER — SODIUM CHLORIDE 0.9 % IV SOLN
8.0000 mg | Freq: Four times a day (QID) | INTRAVENOUS | Status: DC | PRN
  Filled 2018-02-09: qty 4

## 2018-02-09 MED ORDER — SUGAMMADEX SODIUM 200 MG/2ML IV SOLN
INTRAVENOUS | Status: DC | PRN
  Administered 2018-02-09: 200 mg via INTRAVENOUS

## 2018-02-09 MED ORDER — BUPIVACAINE LIPOSOME 1.3 % IJ SUSP
INTRAMUSCULAR | Status: AC
  Filled 2018-02-09: qty 20

## 2018-02-09 MED ORDER — DIPHENHYDRAMINE HCL 50 MG/ML IJ SOLN: 25.0000 mg | Freq: Four times a day (QID) | INTRAMUSCULAR | Status: DC | PRN

## 2018-02-09 MED ORDER — PROMETHAZINE HCL 25 MG/ML IJ SOLN: 25.0000 mg | Freq: Four times a day (QID) | INTRAMUSCULAR | Status: DC | PRN

## 2018-02-09 MED ORDER — LACTATED RINGERS IV SOLN
INTRAVENOUS | Status: DC
  Administered 2018-02-09 (×2): via INTRAVENOUS

## 2018-02-09 MED ORDER — 0.9 % SODIUM CHLORIDE (POUR BTL) OPTIME
TOPICAL | Status: DC | PRN
  Administered 2018-02-09: 2000 mL
  Administered 2018-02-09: 1000 mL

## 2018-02-09 MED ORDER — ZOLPIDEM TARTRATE 5 MG PO TABS: 5.0000 mg | ORAL_TABLET | Freq: Every evening | ORAL | Status: DC | PRN

## 2018-02-09 MED ORDER — LEVOCETIRIZINE DIHYDROCHLORIDE 5 MG PO TABS: 5.0000 mg | ORAL_TABLET | Freq: Every day | ORAL | Status: DC | PRN

## 2018-02-09 MED ORDER — MEPERIDINE HCL 50 MG/ML IJ SOLN
6.2500 mg | INTRAMUSCULAR | Status: DC | PRN
Start: 2018-02-09 — End: 2018-02-09

## 2018-02-09 MED ORDER — PROPOFOL 10 MG/ML IV BOLUS
INTRAVENOUS | Status: AC
  Filled 2018-02-09: qty 40

## 2018-02-09 MED ORDER — ROCURONIUM BROMIDE 50 MG/5ML IV SOLN
INTRAVENOUS | Status: AC
  Filled 2018-02-09: qty 2

## 2018-02-09 MED ORDER — BUPIVACAINE LIPOSOME 1.3 % IJ SUSP
INTRAMUSCULAR | Status: DC | PRN
  Administered 2018-02-09: 20 mL

## 2018-02-09 MED ORDER — FENTANYL CITRATE (PF) 100 MCG/2ML IJ SOLN
INTRAMUSCULAR | Status: AC
  Filled 2018-02-09: qty 2

## 2018-02-09 MED ORDER — ALUM & MAG HYDROXIDE-SIMETH 200-200-20 MG/5ML PO SUSP: 30.0000 mL | ORAL | Status: DC | PRN

## 2018-02-09 MED ORDER — PROPOFOL 10 MG/ML IV BOLUS
INTRAVENOUS | Status: DC | PRN
  Administered 2018-02-09: 200 mg via INTRAVENOUS

## 2018-02-09 MED ORDER — OXYCODONE-ACETAMINOPHEN 5-325 MG PO TABS
1.0000 | ORAL_TABLET | ORAL | Status: DC | PRN
  Administered 2018-02-10 (×2): 1 via ORAL
  Filled 2018-02-09 (×2): qty 1

## 2018-02-09 MED ORDER — PROMETHAZINE HCL 25 MG/ML IJ SOLN
INTRAMUSCULAR | Status: AC
  Filled 2018-02-09: qty 1

## 2018-02-09 MED ORDER — PROMETHAZINE HCL 25 MG/ML IJ SOLN
6.2500 mg | INTRAMUSCULAR | Status: DC | PRN
  Administered 2018-02-09: 6.25 mg via INTRAVENOUS

## 2018-02-09 MED ORDER — FENTANYL CITRATE (PF) 250 MCG/5ML IJ SOLN
INTRAMUSCULAR | Status: AC
  Filled 2018-02-09: qty 5

## 2018-02-09 MED ORDER — LACTATED RINGERS IV SOLN: INTRAVENOUS | Status: DC

## 2018-02-09 MED ORDER — SENNOSIDES-DOCUSATE SODIUM 8.6-50 MG PO TABS: 1.0000 | ORAL_TABLET | Freq: Every evening | ORAL | Status: DC | PRN

## 2018-02-09 MED ORDER — SEVOFLURANE IN SOLN
RESPIRATORY_TRACT | Status: AC
  Filled 2018-02-09: qty 250

## 2018-02-09 MED ORDER — KETOROLAC TROMETHAMINE 30 MG/ML IJ SOLN
30.0000 mg | Freq: Once | INTRAMUSCULAR | Status: AC
  Administered 2018-02-09: 30 mg via INTRAVENOUS
  Filled 2018-02-09: qty 1

## 2018-02-09 MED ORDER — FENTANYL CITRATE (PF) 250 MCG/5ML IJ SOLN
INTRAMUSCULAR | Status: DC | PRN
  Administered 2018-02-09: 50 ug via INTRAVENOUS
  Administered 2018-02-09: 25 ug via INTRAVENOUS
  Administered 2018-02-09: 50 ug via INTRAVENOUS
  Administered 2018-02-09: 100 ug via INTRAVENOUS
  Administered 2018-02-09 (×2): 50 ug via INTRAVENOUS
  Administered 2018-02-09: 25 ug via INTRAVENOUS

## 2018-02-09 MED ORDER — ONDANSETRON HCL 4 MG PO TABS
8.0000 mg | ORAL_TABLET | Freq: Four times a day (QID) | ORAL | Status: DC | PRN
Start: 2018-02-09 — End: 2018-02-10

## 2018-02-09 MED ORDER — SUGAMMADEX SODIUM 200 MG/2ML IV SOLN
INTRAVENOUS | Status: AC
  Filled 2018-02-09: qty 2

## 2018-02-09 MED ORDER — BISACODYL 10 MG RE SUPP: 10.0000 mg | Freq: Every day | RECTAL | Status: DC | PRN

## 2018-02-09 MED ORDER — SODIUM CHLORIDE 0.9% FLUSH
INTRAVENOUS | Status: AC
  Filled 2018-02-09: qty 10

## 2018-02-09 MED ORDER — ZOLPIDEM TARTRATE 5 MG PO TABS: 10.0000 mg | ORAL_TABLET | Freq: Every evening | ORAL | Status: DC | PRN

## 2018-02-09 MED ORDER — LORATADINE 10 MG PO TABS
10.0000 mg | ORAL_TABLET | Freq: Every day | ORAL | Status: DC
  Administered 2018-02-10: 10 mg via ORAL
  Filled 2018-02-09: qty 1

## 2018-02-09 MED ORDER — LIDOCAINE HCL (PF) 1 % IJ SOLN
INTRAMUSCULAR | Status: AC
  Filled 2018-02-09: qty 5

## 2018-02-09 MED ORDER — DEXAMETHASONE SODIUM PHOSPHATE 10 MG/ML IJ SOLN
INTRAMUSCULAR | Status: DC | PRN
  Administered 2018-02-09: 4 mg via INTRAVENOUS

## 2018-02-09 MED ORDER — POTASSIUM CHLORIDE IN NACL 20-0.45 MEQ/L-% IV SOLN
INTRAVENOUS | Status: DC
  Administered 2018-02-09 – 2018-02-10 (×2): via INTRAVENOUS
  Filled 2018-02-09 (×3): qty 1000

## 2018-02-09 MED ORDER — ONDANSETRON HCL 4 MG/2ML IJ SOLN
INTRAMUSCULAR | Status: DC | PRN
  Administered 2018-02-09: 4 mg via INTRAVENOUS

## 2018-02-09 MED ORDER — BUPIVACAINE LIPOSOME 1.3 % IJ SUSP
20.0000 mL | Freq: Once | INTRAMUSCULAR | Status: DC
  Administered 2018-02-09: 266 mg
  Filled 2018-02-09: qty 20

## 2018-02-09 MED ORDER — ONDANSETRON HCL 4 MG/2ML IJ SOLN
INTRAMUSCULAR | Status: AC
  Filled 2018-02-09: qty 2

## 2018-02-09 MED ORDER — ALBUTEROL SULFATE (2.5 MG/3ML) 0.083% IN NEBU
2.5000 mg | INHALATION_SOLUTION | Freq: Four times a day (QID) | RESPIRATORY_TRACT | Status: DC | PRN
Start: 2018-02-09 — End: 2018-02-10

## 2018-02-09 MED ORDER — MIDAZOLAM HCL 2 MG/2ML IJ SOLN
INTRAMUSCULAR | Status: AC
  Filled 2018-02-09: qty 2

## 2018-02-09 MED ORDER — MIDAZOLAM HCL 5 MG/5ML IJ SOLN
INTRAMUSCULAR | Status: DC | PRN
  Administered 2018-02-09: 2 mg via INTRAVENOUS

## 2018-02-09 MED ORDER — GENTAMICIN SULFATE 40 MG/ML IJ SOLN
INTRAMUSCULAR | Status: AC
  Administered 2018-02-09: 450 mg via INTRAVENOUS
  Filled 2018-02-09: qty 11.25

## 2018-02-09 MED ORDER — PAROXETINE HCL 20 MG PO TABS
20.0000 mg | ORAL_TABLET | Freq: Every day | ORAL | Status: DC
  Administered 2018-02-10: 20 mg via ORAL
  Filled 2018-02-09: qty 1

## 2018-02-09 MED ORDER — HYDROMORPHONE HCL 1 MG/ML IJ SOLN
0.2500 mg | INTRAMUSCULAR | Status: DC | PRN
  Administered 2018-02-09 (×3): 0.5 mg via INTRAVENOUS
  Filled 2018-02-09 (×3): qty 0.5

## 2018-02-09 MED ORDER — DEXAMETHASONE SODIUM PHOSPHATE 4 MG/ML IJ SOLN
INTRAMUSCULAR | Status: AC
  Filled 2018-02-09: qty 1

## 2018-02-09 SURGICAL SUPPLY — 49 items
ADH SKN CLS APL DERMABOND .7 (GAUZE/BANDAGES/DRESSINGS) ×2
APPLIER CLIP 13 LRG OPEN (CLIP)
APR CLP LRG 13 20 CLIP (CLIP)
BLADE SURG SZ10 CARB STEEL (BLADE) ×4 IMPLANT
CELLS DAT CNTRL 66122 CELL SVR (MISCELLANEOUS) ×2 IMPLANT
CLIP APPLIE 13 LRG OPEN (CLIP) IMPLANT
CLOTH BEACON ORANGE TIMEOUT ST (SAFETY) ×4 IMPLANT
COVER LIGHT HANDLE STERIS (MISCELLANEOUS) ×8 IMPLANT
DERMABOND ADVANCED (GAUZE/BANDAGES/DRESSINGS) ×2
DERMABOND ADVANCED .7 DNX12 (GAUZE/BANDAGES/DRESSINGS) ×2 IMPLANT
DRAPE WARM FLUID 44X44 (DRAPE) ×4 IMPLANT
DRSG OPSITE POSTOP 4X8 (GAUZE/BANDAGES/DRESSINGS) ×4 IMPLANT
ELECT REM PT RETURN 9FT ADLT (ELECTROSURGICAL) ×4
ELECTRODE REM PT RTRN 9FT ADLT (ELECTROSURGICAL) ×2 IMPLANT
GAUZE SPONGE 4X4 12PLY STRL (GAUZE/BANDAGES/DRESSINGS) ×4 IMPLANT
GLOVE BIOGEL PI IND STRL 6.5 (GLOVE) IMPLANT
GLOVE BIOGEL PI IND STRL 7.0 (GLOVE) ×2 IMPLANT
GLOVE BIOGEL PI IND STRL 8 (GLOVE) ×2 IMPLANT
GLOVE BIOGEL PI INDICATOR 6.5 (GLOVE) ×2
GLOVE BIOGEL PI INDICATOR 7.0 (GLOVE) ×6
GLOVE BIOGEL PI INDICATOR 8 (GLOVE) ×2
GLOVE ECLIPSE 6.5 STRL STRAW (GLOVE) ×2 IMPLANT
GLOVE ECLIPSE 8.0 STRL XLNG CF (GLOVE) ×4 IMPLANT
GLOVE SURG SS PI 6.5 STRL IVOR (GLOVE) ×2 IMPLANT
GOWN STRL REUS W/ TWL LRG LVL3 (GOWN DISPOSABLE) ×4 IMPLANT
GOWN STRL REUS W/TWL LRG LVL3 (GOWN DISPOSABLE) ×8
GOWN STRL REUS W/TWL XL LVL3 (GOWN DISPOSABLE) ×4 IMPLANT
HEMOSTAT ARISTA ABSORB 3G PWDR (MISCELLANEOUS) ×8 IMPLANT
INST SET MAJOR GENERAL (KITS) ×4 IMPLANT
KIT TURNOVER KIT A (KITS) ×4 IMPLANT
MANIFOLD NEPTUNE II (INSTRUMENTS) ×4 IMPLANT
NDL HYPO 21X1.5 SAFETY (NEEDLE) ×2 IMPLANT
NEEDLE HYPO 21X1.5 SAFETY (NEEDLE) ×4 IMPLANT
NS IRRIG 1000ML POUR BTL (IV SOLUTION) ×10 IMPLANT
PACK ABDOMINAL MAJOR (CUSTOM PROCEDURE TRAY) ×4 IMPLANT
PAD ARMBOARD 7.5X6 YLW CONV (MISCELLANEOUS) ×4 IMPLANT
RETRACTOR WND ALEXIS 18 MED (MISCELLANEOUS) IMPLANT
RTRCTR WOUND ALEXIS 18CM MED (MISCELLANEOUS) ×4
SET BASIN LINEN APH (SET/KITS/TRAYS/PACK) ×4 IMPLANT
SPONGE LAP 18X18 X RAY DECT (DISPOSABLE) ×2 IMPLANT
SUT CHROMIC 0 CT 1 (SUTURE) ×4 IMPLANT
SUT MON AB 3-0 SH 27 (SUTURE) ×8 IMPLANT
SUT VIC AB 0 CT1 27 (SUTURE) ×12
SUT VIC AB 0 CT1 27XCR 8 STRN (SUTURE) ×4 IMPLANT
SUT VIC AB 0 CTX 36 (SUTURE) ×4
SUT VIC AB 0 CTX36XBRD ANTBCTR (SUTURE) ×2 IMPLANT
SUT VICRYL 3 0 (SUTURE) ×2 IMPLANT
SYR 20CC LL (SYRINGE) ×4 IMPLANT
TRAY FOLEY MTR SLVR 16FR STAT (SET/KITS/TRAYS/PACK) ×4 IMPLANT

## 2018-02-09 NOTE — Anesthesia Postprocedure Evaluation (Signed)
Anesthesia Post Note  Patient: Rachel Jefferson  Procedure(s) Performed: HYSTERECTOMY SUPRACERVICAL ABDOMINAL (N/A Abdomen) BILATERAL SALPINGECTOMY (Bilateral Abdomen)  Patient location during evaluation: PACU Anesthesia Type: General Level of consciousness: awake and alert Pain management: pain level controlled Vital Signs Assessment: post-procedure vital signs reviewed and stable Respiratory status: spontaneous breathing Cardiovascular status: blood pressure returned to baseline Postop Assessment: no apparent nausea or vomiting Anesthetic complications: no     Last Vitals:  Vitals:   02/09/18 1230 02/09/18 1245  BP: 136/88 140/88  Pulse: 74 65  Resp: (!) 9 16  Temp:    SpO2: 98% 99%    Last Pain:  Vitals:   02/09/18 1223  TempSrc:   PainSc: 8                  Liliana Brentlinger

## 2018-02-09 NOTE — Op Note (Signed)
Preoperative diagnosis:  1.  menometrorrhagia                                          2.  dysmenorrhea                                         3.  Enlarged fibroid uterus                                           Postoperative diagnosis:  Same as above   Procedure:  Abdominal hysterectomy, supracervical with removal of both Fallopian tubes  Surgeon:  [name]  Assistant:    Anesthesia:  General endotracheal  Preoperative clinical summary:    Intraoperative findings: 12-14 weeks size fibroid uterus, multiple fibroids, left ureter very close to the uterine artery on the left, both ovaries and tubes and peritoneal surfaces are normal  Description of operation:  Patient was taken to the operating room and placed in the supine position where she underwent general endotracheal anesthesia.  She was then prepped and draped in the usual sterile fashion and a Foley catheter was placed for continuous bladder drainage.  A Pfannenstiel skin incision was made and carried down sharply to the rectus fascia which was scored in the midline and extended laterally.  The fascia was taken off the muscles superiorly and inferiorly without difficulty.  The muscles were divided.  The peritoneal cavity was entered.  An medium Alexis self-retaining retractor was placed.  The upper abdomen was packed away.  The incision was 8 cm in length.  uterine cornu were grasped with Coker clamps.  The left round ligament was suture ligated and coagulated with the electrocautery unit.  The left vesicouterine serosal flap was created.  An avascular window in in the peritoneum was created and the utero-ovarian ligament was cross clamped, cut and suture ligated.  The right round ligament was suture ligated and cut with the electrocautery unit.  The vesicouterine serosal flap on the right was created.  An avascular window in the peritoneum was created and the right utero-ovarian ligament was cross clamped, cut and double suture  ligated.  Thus both ovaries were preserved.  The uterine vessels were skeletonized bilaterally.  The left uterine artery was very tortuous and had a shallow approach to the uterus. I identified the left ureter and it was much closer to the uterine artery than is normal.  I confirmed ureteral peristalsis and location under the left uterine artery before proceeding at this point. After ensuring the left ureter was not going to be involved, both uterine vessels were clamped bilaterally,  then cut and suture ligated.  Two more pedicles were taken down the cervix medial to the uterine vessels.  Each pedicle was clamped cut and suture ligated with good resulting hemostasis.  As per the preoperative plan the cervix was then transected sharply and the specimen was removed.  The cervical stump was then closed anterior to posterior for hemostasis and reduce postoperative adhesions.  The pelvis was irrigated vigorously and all pedicles were examined and found to be hemostatic.   All specimens were sent to pathology for routine evaluation.  I took great care to  ensure normal left ureteral peristlasis and safety before proceeding to closure.  Normal peristalsis was confirmed.  Arista was placed in the pelvis for additional post operative hemostasis.  The Alexis self-retaining retractor was removed and the pelvis was irrigated vigorously.  All packs were removed and all counts were correct at this point x 3.  The muscles and peritoneum were reapproximated loosely.  The fascia was closed with 0 Vicryl running.    The skin was closed using 3-0 Vicryl on a Keith needle in a subcuticular fashion.  Dermabond was then applied for additional wound integrity and to serve as a postoperative bacterial barrier.  The patient was awakened from anesthesia taken to the recovery room in good stable condition. All sponge instrument and needle counts were correct x 3.  The patient received Ancef and Toradol prophylactically preoperatively.   Estimated blood loss for the procedure was 150  cc.  [name] 02/09/2018 12:27 PM

## 2018-02-09 NOTE — H&P (Signed)
Preoperative History and Physical  Rachel Jefferson is a 37 y.o. [id number] with Patient's last menstrual period was 01/15/2018. admitted for a abdominal supracervical hysterectomy with removal of both Fallopian tubes.    Chief Complaint  Patient presents with  . Follow-up    [place] today   Poor response to megestrol, daily spotting  Blood pressure 110/60, height 5\' 4"  (1.626 m), weight 193 lb (87.5 kg), last menstrual period 11/15/2017, not currently breastfeeding.  US Pelvis Transvanginal Non-ob (tv Only)  Result Date: 12/16/2017 GYNECOLOGIC SONOGRAM Rachel Jefferson is a 37 y.o. [id number] LMP 11/15/2017,she is here for a pelvic sonogram for menorrhagia,dysmenorrhea. Uterus                      12 x 7 x 8 cm, Vol 352 ml, enlarged heterogeneous anteverted uterus w/mult fibroids Endometrium          13 mm, symmetrical, heterogeneous, distorted by submucosal fibroid Right ovary             2.1 x 1.9 x 2 cm, wnl Left ovary                2.8 x 2.4 x 2.7 cm, wnl (limited view) Fibroids                   (#1) anterior left subserosal fibroid 4.5 x 3.4 x 3.4 cm,(#2) anterior right intramural fibroid 2.5 x 2.6 x 2.4 cm,(#3) posterior mid uterus submucosal fibroid 3.4 x 3.2 x 2.5 cm Technician Comments: PELVIC US TA/TV: enlarged heterogeneous anteverted uterus w/mult fibroids,(#1) anterior left subserosal fibroid 4.5 x 3.4 x 3.4 cm,(#2) anterior right intramural fibroid 2.5 x 2.6 x 2.4 cm,(#3) posterior mid uterus submucosal fibroid 3.4 x 3.2 x 2.5 cm, endometrium is distorted by submucosal fibroid ,EEC 13 mm,mult simple nabothian cyst,normal ovaries bilat,limited view of left ovary,right ovary appears mobile,no free fluid,no pain during ultrasound [employer] 12/16/2017 3:46 PM Clinical Impression and recommendations: I have reviewed the sonogram results above, combined with the patient's current clinical course, below are my impressions and any appropriate recommendations for management based on the  sonographic findings. Enlarged uterus 12 week size with fibroids one of which is submucosal Endometrium is distorted by fibroids Both ovaries are normal [name] 12/16/2017 3:58 PM   US Pelvis (transabdominal Only)  Result Date: 12/16/2017 GYNECOLOGIC SONOGRAM Rachel Jefferson is a 37 y.o. [id number] LMP 11/15/2017,she is here for a pelvic sonogram for menorrhagia,dysmenorrhea. Uterus                      12 x 7 x 8 cm, Vol 352 ml, enlarged heterogeneous anteverted uterus w/mult fibroids Endometrium          13 mm, symmetrical, heterogeneous, distorted by submucosal fibroid Right ovary             2.1 x 1.9 x 2 cm, wnl Left ovary                2.8 x 2.4 x 2.7 cm, wnl (limited view) Fibroids                   (#1) anterior left subserosal fibroid 4.5 x 3.4 x 3.4 cm,(#2) anterior right intramural fibroid 2.5 x 2.6 x 2.4 cm,(#3) posterior mid uterus submucosal fibroid 3.4 x 3.2 x 2.5 cm Technician Comments: PELVIC US TA/TV: enlarged heterogeneous anteverted uterus w/mult fibroids,(#1) anterior left subserosal fibroid 4.5 x 3.4 x 3.4  cm,(#2) anterior right intramural fibroid 2.5 x 2.6 x 2.4 cm,(#3) posterior mid uterus submucosal fibroid 3.4 x 3.2 x 2.5 cm, endometrium is distorted by submucosal fibroid ,EEC 13 mm,mult simple nabothian cyst,normal ovaries bilat,limited view of left ovary,right ovary appears mobile,no free fluid,no pain during ultrasound [employer] 12/16/2017 3:46 PM Clinical Impression and recommendations: I have reviewed the sonogram results above, combined with the patient's current clinical course, below are my impressions and any appropriate recommendations for management based on the sonographic findings. Enlarged uterus 12 week size with fibroids one of which is submucosal Endometrium is distorted by fibroids Both ovaries are normal [name] 12/16/2017 3:58 PM      MEDS ordered this encounter:     Meds ordered this encounter  Medications  . PARoxetine (PAXIL) 10 MG  tablet    Sig: Take 1 tablet (10 mg total) by mouth daily.    Dispense:  30 tablet    Refill:  1    Orders for this encounter: No orders of the defined types were placed in this encounter.   Impression: Menorrhagia with regular cycle  Dysmenorrhea  Fibroids, submucosal  Fibroids  Moderate episode of recurrent major depressive disorder (HCC)     Plan: Abdominal supracervical hysterectomy with removal of both Fallopian tubes, preserve ovaries  02/09/2018     PMH:    Past Medical History:  Diagnosis Date  . Anxiety   . Asthma   . Bronchitis   . Dyspnea   . GERD (gastroesophageal reflux disease)     PSH:     Past Surgical History:  Procedure Laterality Date  . TUBAL LIGATION    . tubes tied      POb/GynH:      OB History    Gravida  5   Para  3   Term      Preterm      AB  2   Living  3     SAB      TAB  2   Ectopic      Multiple      Live Births              SH:   Social History   Tobacco Use  . Smoking status: Never Smoker  . Smokeless tobacco: Never Used  Substance Use Topics  . Alcohol use: No  . Drug use: No    FH:    Family History  Problem Relation Age of Onset  . Heart attack Paternal Grandfather   . Diabetes Paternal Grandmother   . Hypertension Mother      Allergies:  Allergies  Allergen Reactions  . Penicillins Hives and Swelling    Has patient had a PCN reaction causing immediate rash, facial/tongue/throat swelling, SOB or lightheadedness with hypotension: Yes Has patient had a PCN reaction causing severe rash involving mucus membranes or skin necrosis: No Has patient had a PCN reaction that required hospitalization: unknown Has patient had a PCN reaction occurring within the last 10 years:No If all of the above answers are "NO", then may proceed with Cephalosporin use.     Medications:       Current Facility-Administered Medications:  .  bupivacaine liposome (EXPAREL) 1.3 %  injection 266 mg, 20 mL, Infiltration, Once, Arlen Dupuis, Mertie Clause, MD .  gentamicin (GARAMYCIN) 450 mg, clindamycin (CLEOCIN) 900 mg in dextrose 5 % 100 mL IVPB, , Intravenous, On Call to OR, [name], MD  Review of Systems:   Review  of Systems  Constitutional: Negative for fever, chills, weight loss, malaise/fatigue and diaphoresis.  HENT: Negative for hearing loss, ear pain, nosebleeds, congestion, sore throat, neck pain, tinnitus and ear discharge.   Eyes: Negative for blurred vision, double vision, photophobia, pain, discharge and redness.  Respiratory: Negative for cough, hemoptysis, sputum production, shortness of breath, wheezing and stridor.   Cardiovascular: Negative for chest pain, palpitations, orthopnea, claudication, leg swelling and PND.  Gastrointestinal: Positive for abdominal pain. Negative for heartburn, nausea, vomiting, diarrhea, constipation, blood in stool and melena.  Genitourinary: Negative for dysuria, urgency, frequency, hematuria and flank pain.  Musculoskeletal: Negative for myalgias, back pain, joint pain and falls.  Skin: Negative for itching and rash.  Neurological: Negative for dizziness, tingling, tremors, sensory change, speech change, focal weakness, seizures, loss of consciousness, weakness and headaches.  Endo/Heme/Allergies: Negative for environmental allergies and polydipsia. Does not bruise/bleed easily.  Psychiatric/Behavioral: Negative for depression, suicidal ideas, hallucinations, memory loss and substance abuse. The patient is not nervous/anxious and does not have insomnia.      PHYSICAL EXAM:  Blood pressure 125/82, pulse 77, temperature 98.1 F (36.7 C), temperature source Oral, resp. rate 16, last menstrual period 01/15/2018, SpO2 99 %.    Vitals reviewed. Constitutional: She is oriented to person, place, and time. She appears well-developed and well-nourished.  HENT:  Head: Normocephalic and atraumatic.  Right Ear: External ear normal.   Left Ear: External ear normal.  Nose: Nose normal.  Mouth/Throat: Oropharynx is clear and moist.  Eyes: Conjunctivae and EOM are normal. Pupils are equal, round, and reactive to light. Right eye exhibits no discharge. Left eye exhibits no discharge. No scleral icterus.  Neck: Normal range of motion. Neck supple. No tracheal deviation present. No thyromegaly present.  Cardiovascular: Normal rate, regular rhythm, normal heart sounds and intact distal pulses.  Exam reveals no gallop and no friction rub.   No murmur heard. Respiratory: Effort normal and breath sounds normal. No respiratory distress. She has no wheezes. She has no rales. She exhibits no tenderness.  GI: Soft. Bowel sounds are normal. She exhibits no distension and no mass. There is tenderness. There is no rebound and no guarding.  Genitourinary:       Vulva is normal without lesions Vagina is pink moist without discharge Cervix normal in appearance and pap is normal Uterus is 12 weeks size Adnexa is negative with normal sized ovaries by sonogram  Musculoskeletal: Normal range of motion. She exhibits no edema and no tenderness.  Neurological: She is alert and oriented to person, place, and time. She has normal reflexes. She displays normal reflexes. No cranial nerve deficit. She exhibits normal muscle tone. Coordination normal.  Skin: Skin is warm and dry. No rash noted. No erythema. No pallor.  Psychiatric: She has a normal mood and affect. Her behavior is normal. Judgment and thought content normal.    Labs: Results for orders placed or performed during the hospital encounter of 02/07/18 (from the past 336 hour(s))  CBC   Collection Time: 02/07/18 12:42 PM  Result Value Ref Range   WBC 9.3 4.0 - 10.5 K/uL   RBC 4.89 3.87 - 5.11 MIL/uL   Hemoglobin 12.6 12.0 - 15.0 g/dL   HCT 39.4 36.0 - 46.0 %   MCV 80.6 78.0 - 100.0 fL   MCH 25.8 (L) 26.0 - 34.0 pg   MCHC 32.0 30.0 - 36.0 g/dL   RDW 14.9 11.5 - 15.5 %   Platelets 324  150 - 400 K/uL  Comprehensive  metabolic panel   Collection Time: 02/07/18 12:42 PM  Result Value Ref Range   Sodium 138 135 - 145 mmol/L   Potassium 3.7 3.5 - 5.1 mmol/L   Chloride 109 98 - 111 mmol/L   CO2 20 (L) 22 - 32 mmol/L   Glucose, Bld 94 70 - 99 mg/dL   BUN 15 6 - 20 mg/dL   Creatinine, Ser 0.71 0.44 - 1.00 mg/dL   Calcium 8.9 8.9 - 10.3 mg/dL   Total Protein 7.7 6.5 - 8.1 g/dL   Albumin 3.7 3.5 - 5.0 g/dL   AST 15 15 - 41 U/L   ALT 14 0 - 44 U/L   Alkaline Phosphatase 70 38 - 126 U/L   Total Bilirubin 0.5 0.3 - 1.2 mg/dL   GFR calc non Af Amer >60 >60 mL/min   GFR calc Af Amer >60 >60 mL/min   Anion gap 9 5 - 15  hCG, quantitative, pregnancy   Collection Time: 02/07/18 12:42 PM  Result Value Ref Range   hCG, Beta Chain, Quant, S <1 <5 mIU/mL  Rapid HIV screen (HIV 1/2 Ab+Ag)   Collection Time: 02/07/18 12:42 PM  Result Value Ref Range   HIV-1 P24 Antigen - HIV24 NON REACTIVE NON REACTIVE   HIV 1/2 Antibodies NON REACTIVE NON REACTIVE   Interpretation (HIV Ag Ab)      A non reactive test result means that HIV 1 or HIV 2 antibodies and HIV 1 p24 antigen were not detected in the specimen.  Type and screen   Collection Time: 02/07/18 12:42 PM  Result Value Ref Range   ABO/RH(D) B POS    Antibody Screen POS    Sample Expiration 02/21/2018    Antibody Identification ANTI LEA Bobby Rumpf a)    PT AG Type NEGATIVE FOR LEWIS A ANTIGEN    Unit Number [id number]    Blood Component Type RED CELLS,LR    Unit division 00    Status of Unit ALLOCATED    Transfusion Status OK TO TRANSFUSE    Crossmatch Result COMPATIBLE    Unit Number      [id number] Performed at [facility], [place] [address] [place], [place] [address]    Blood Component Type      RED CELLS,LR Performed at [facility] [address], [place], [place] [address]    Unit division      00 Performed at [facility], [place] [address] [place], [place] [address]    Status of Unit       ALLOCATED Performed at [facility], [address] [place], [place] [address]    Transfusion Status OK TO TRANSFUSE    Crossmatch Result COMPATIBLE   BPAM RBC   Collection Time: 02/07/18 12:42 PM  Result Value Ref Range   Blood Product Unit Number [id number]    Unit Type and Rh 7300    Blood Product Expiration Date 536144315400    Blood Product Unit Number [id number]    Unit Type and Rh 7300    Blood Product Expiration Date 124580998338   Urinalysis, Routine w reflex microscopic   Collection Time: 02/07/18 12:58 PM  Result Value Ref Range   Color, Urine YELLOW YELLOW   APPearance CLEAR CLEAR   Specific Gravity, Urine 1.024 1.005 - 1.030   pH 5.0 5.0 - 8.0   Glucose, UA NEGATIVE NEGATIVE mg/dL   Hgb urine dipstick SMALL (A) NEGATIVE   Bilirubin Urine NEGATIVE NEGATIVE   Ketones, ur NEGATIVE  NEGATIVE mg/dL   Protein, ur NEGATIVE NEGATIVE mg/dL   Nitrite NEGATIVE NEGATIVE   Leukocytes, UA NEGATIVE NEGATIVE   RBC / HPF 0-5 0 - 5 RBC/hpf   WBC, UA 0-5 0 - 5 WBC/hpf   Bacteria, UA NONE SEEN NONE SEEN   Squamous Epithelial / LPF 0-5 0 - 5   Mucus PRESENT     EKG: Orders placed or performed during the hospital encounter of 10/17/13  . ED EKG  . ED EKG  . EKG 12-Lead  . EKG 12-Lead  . EKG    Imaging Studies: No results found.    Assessment: Menometrorrhagia Dysmenorrhea Fibroid uterus 12 weeks size  Plan: Abdominal supracervical hysterectomy with removal of both Fallopian tubes  Pt understands the risks of surgery including but not limited t  excessive bleeding requiring transfusion or reoperation, post-operative infection requiring prolonged hospitalization or re-hospitalization and antibiotic therapy, and damage to other organs including bladder, bowel, ureters and major vessels.  The patient also understands the alternative treatment options which were discussed in full.  All questions were answered.  Mertie Clause Chrystian Cupples 02/09/2018 10:06 AM   [name] 02/09/2018 10:03 AM

## 2018-02-09 NOTE — Anesthesia Procedure Notes (Signed)
Procedure Name: Intubation Date/Time: 02/09/2018 10:26 AM Performed by: Talbot Grumbling, CRNA Pre-anesthesia Checklist: Patient identified, Emergency Drugs available, Suction available and Patient being monitored Patient Re-evaluated:Patient Re-evaluated prior to induction Oxygen Delivery Method: Circle system utilized Preoxygenation: Pre-oxygenation with 100% oxygen Induction Type: IV induction Ventilation: Mask ventilation without difficulty Laryngoscope Size: Mac and 3 Grade View: Grade I Tube type: Oral Tube size: 7.0 mm Number of attempts: 1 Airway Equipment and Method: Stylet Placement Confirmation: ETT inserted through vocal cords under direct vision,  positive ETCO2 and breath sounds checked- equal and bilateral Secured at: 22 cm Tube secured with: Tape Dental Injury: Teeth and Oropharynx as per pre-operative assessment

## 2018-02-09 NOTE — Anesthesia Preprocedure Evaluation (Signed)
Anesthesia Evaluation  Patient identified by MRN, date of birth, ID band Patient awake    Reviewed: Allergy & Precautions, H&P , NPO status , Patient's Chart, lab work & pertinent test results, reviewed documented beta blocker date and time   Airway Mallampati: II  TM Distance: >3 FB Neck ROM: full    Dental no notable dental hx. (+) Teeth Intact, Dental Advidsory Given   Pulmonary neg pulmonary ROS, shortness of breath, asthma ,    Pulmonary exam normal breath sounds clear to auscultation       Cardiovascular Exercise Tolerance: Good negative cardio ROS   Rhythm:regular Rate:Normal     Neuro/Psych Anxiety negative neurological ROS  negative psych ROS   GI/Hepatic negative GI ROS, Neg liver ROS, GERD  ,  Endo/Other  negative endocrine ROS  Renal/GU negative Renal ROS  negative genitourinary   Musculoskeletal   Abdominal   Peds  Hematology negative hematology ROS (+)   Anesthesia Other Findings Denies CV, endo, renal, CNS hx.  Erwtional dyspnea with asthma at times  S/P Neb tx last night for sx today obese  Reproductive/Obstetrics negative OB ROS                             Anesthesia Physical Anesthesia Plan  ASA: II  Anesthesia Plan: General ETT   Post-op Pain Management:    Induction:   PONV Risk Score and Plan:   Airway Management Planned:   Additional Equipment:   Intra-op Plan:   Post-operative Plan:   Informed Consent: I have reviewed the patients History and Physical, chart, labs and discussed the procedure including the risks, benefits and alternatives for the proposed anesthesia with the patient or authorized representative who has indicated his/her understanding and acceptance.   Dental Advisory Given  Plan Discussed with: CRNA and Anesthesiologist  Anesthesia Plan Comments:         Anesthesia Quick Evaluation

## 2018-02-09 NOTE — Progress Notes (Signed)
Removed patients foley catheter per order. Patient tolerated well. Informed patient that she will need to walk and try to use the bathroom within the next 6 hours. Will continue to monitor.

## 2018-02-09 NOTE — Transfer of Care (Signed)
Immediate Anesthesia Transfer of Care Note  Patient: Rachel Jefferson  Procedure(s) Performed: HYSTERECTOMY SUPRACERVICAL ABDOMINAL (N/A Abdomen) BILATERAL SALPINGECTOMY (Bilateral Abdomen)  Patient Location: PACU  Anesthesia Type:General  Level of Consciousness: awake, alert , oriented and patient cooperative  Airway & Oxygen Therapy: Patient Spontanous Breathing  Post-op Assessment: Report given to RN and Post -op Vital signs reviewed and stable  Post vital signs: Reviewed and stable  Last Vitals:  Vitals Value Taken Time  BP 143/95 02/09/2018 12:23 PM  Temp    Pulse 71 02/09/2018 12:25 PM  Resp 12 02/09/2018 12:25 PM  SpO2 98 % 02/09/2018 12:25 PM  Vitals shown include unvalidated device data.  Last Pain:  Vitals:   02/09/18 0856  TempSrc: Oral  PainSc: 0-No pain         Complications: No apparent anesthesia complications

## 2018-02-09 NOTE — Interval H&P Note (Signed)
History and Physical Interval Note:  02/09/2018 10:07 AM  Rachel Jefferson  has presented today for surgery, with the diagnosis of menorrhagia dysmenorrhea fibroids  The various methods of treatment have been discussed with the patient and family. After consideration of risks, benefits and other options for treatment, the patient has consented to  Procedure(s): HYSTERECTOMY SUPRACERVICAL ABDOMINAL (N/A) BILATERAL SALPINGECTOMY (Bilateral) as a surgical intervention .  The patient's history has been reviewed, patient examined, no change in status, stable for surgery.  I have reviewed the patient's chart and labs.  Questions were answered to the patient's satisfaction.     [name]

## 2018-02-10 LAB — BASIC METABOLIC PANEL
ANION GAP: 6 (ref 5–15)
BUN: 12 mg/dL (ref 6–20)
CHLORIDE: 104 mmol/L (ref 98–111)
CO2: 24 mmol/L (ref 22–32)
Calcium: 8 mg/dL — ABNORMAL LOW (ref 8.9–10.3)
Creatinine, Ser: 0.78 mg/dL (ref 0.44–1.00)
GFR calc non Af Amer: >60 mL/min (ref >60)
Glucose, Bld: 143 mg/dL — ABNORMAL HIGH (ref 70–99)
Potassium: 4.3 mmol/L (ref 3.5–5.1)
Sodium: 134 mmol/L — ABNORMAL LOW (ref 135–145)

## 2018-02-10 LAB — CBC
HCT: 32.5 % — ABNORMAL LOW (ref 36.0–46.0)
Hemoglobin: 10.2 g/dL — ABNORMAL LOW (ref 12.0–15.0)
MCH: 25.7 pg — ABNORMAL LOW (ref 26.0–34.0)
MCHC: 31.4 g/dL (ref 30.0–36.0)
MCV: 81.9 fL (ref 78.0–100.0)
Platelets: 294 K/uL (ref 150–400)
RBC: 3.97 MIL/uL (ref 3.87–5.11)
RDW: 15 % (ref 11.5–15.5)
WBC: 13.5 K/uL — ABNORMAL HIGH (ref 4.0–10.5)

## 2018-02-10 MED ORDER — ONDANSETRON HCL 8 MG PO TABS: 8.0000 mg | ORAL_TABLET | Freq: Four times a day (QID) | ORAL | 0 refills | Status: DC | PRN

## 2018-02-10 MED ORDER — OXYCODONE-ACETAMINOPHEN 5-325 MG PO TABS: 1.0000 | ORAL_TABLET | ORAL | 0 refills | Status: DC | PRN

## 2018-02-10 MED ORDER — KETOROLAC TROMETHAMINE 10 MG PO TABS: 10.0000 mg | ORAL_TABLET | Freq: Three times a day (TID) | ORAL | 0 refills | Status: DC | PRN

## 2018-02-10 NOTE — Discharge Summary (Signed)
Physician Discharge Summary  Patient ID: Rachel Jefferson MRN: [id number] DOB/AGE: June 05, 1981 37 y.o.  Admit date: 02/09/2018 Discharge date: 02/10/2018  Admission Diagnoses: Active Problems:   S/P abdominal supracervical subtotal hysterectomy Discharge Diagnoses:  Active Problems:   S/P abdominal supracervical subtotal hysterectomy   Discharged Condition: good  Hospital Course: unremarkable  Consults:   Significant Diagnostic Studies:   Treatments: supracervical hysterectomy  Discharge Exam: Blood pressure 117/65, pulse 77, temperature 98.2 F (36.8 C), temperature source Oral, resp. rate 16, height 5\' 4"  (1.626 m), weight 208 lb 8.9 oz (94.6 kg), last menstrual period 01/15/2018, SpO2 100 %. General appearance: alert, cooperative and no distress Incision/Wound:clean dry intact  Disposition: Discharge disposition: 01-Home or Self Care       Discharge Instructions    Call MD for:  persistant nausea and vomiting   Complete by:  As directed    Call MD for:  severe uncontrolled pain   Complete by:  As directed    Call MD for:  temperature >100.4   Complete by:  As directed    Diet - low sodium heart healthy   Complete by:  As directed    Driving Restrictions   Complete by:  As directed    Do not drive til Dr Elonda Husky says OK   Increase activity slowly   Complete by:  As directed    Leave dressing on - Keep it clean, dry, and intact until clinic visit   Complete by:  As directed    Lifting restrictions   Complete by:  As directed    Do not lift more than 10 pounds   Sexual Activity Restrictions   Complete by:  As directed    You gotta be kiddin'!!!     Allergies as of 02/10/2018      Reactions   Penicillins Hives, Swelling   Has patient had a PCN reaction causing immediate rash, facial/tongue/throat swelling, SOB or lightheadedness with hypotension: Yes Has patient had a PCN reaction causing severe rash involving mucus membranes or skin necrosis: No Has  patient had a PCN reaction that required hospitalization: unknown Has patient had a PCN reaction occurring within the last 10 years:No If all of the above answers are "NO", then may proceed with Cephalosporin use.      Medication List    STOP taking these medications   ibuprofen 200 MG tablet Commonly known as:  ADVIL,MOTRIN   megestrol 40 MG tablet Commonly known as:  MEGACE     TAKE these medications   albuterol (2.5 MG/3ML) 0.083% nebulizer solution Commonly known as:  PROVENTIL Take 2.5 mg by nebulization every 6 (six) hours as needed for wheezing or shortness of breath.   HAIR SKIN AND NAILS FORMULA PO Take 2 tablets by mouth daily.   ketorolac 10 MG tablet Commonly known as:  TORADOL Take 1 tablet (10 mg total) by mouth every 8 (eight) hours as needed.   levocetirizine 5 MG tablet Commonly known as:  XYZAL Take 5 mg by mouth daily as needed for allergies.   ondansetron 8 MG tablet Commonly known as:  ZOFRAN Take 1 tablet (8 mg total) by mouth every 6 (six) hours as needed for nausea.   oxyCODONE-acetaminophen 5-325 MG tablet Commonly known as:  PERCOCET/ROXICET Take 1 tablet by mouth every 4 (four) hours as needed for moderate pain ((when tolerating fluids)).   oxymetazoline 0.05 % nasal spray Commonly known as:  AFRIN Place 1 spray into both nostrils daily as needed for congestion.  PARoxetine 20 MG tablet Commonly known as:  PAXIL Take 1 tablet (20 mg total) by mouth daily. What changed:  when to take this   zolpidem 10 MG tablet Commonly known as:  AMBIEN Take 1 tablet (10 mg total) by mouth at bedtime as needed for sleep. What changed:  when to take this      Follow-up Information    [name], MD Follow up on 02/17/2018.   Specialties:  Obstetrics and Gynecology, Radiology Why:  post op Contact information: [place] [id number] [phone]           Signed: [name] 02/10/2018, 12:53 PM

## 2018-02-10 NOTE — Discharge Instructions (Signed)
Abdominal Hysterectomy, Care After °This sheet gives you information about how to care for yourself after your procedure. Your health care provider may also give you more specific instructions. If you have problems or questions, contact your health care provider. °What can I expect after the procedure? °After your procedure, it is common to have: °· Pain. °· Fatigue. °· Poor appetite. °· Less interest in sex. °· Vaginal bleeding and discharge. You may need to use a sanitary napkin after this procedure. ° °Follow these instructions at home: °Bathing °· Do not take baths, swim, or use a hot tub until your health care provider approves. Ask your health care provider if you can take showers. You may only be allowed to take sponge baths for bathing. °· Keep the bandage (dressing) dry until your health care provider says it can be removed. °Incision care °· Follow instructions from your health care provider about how to take care of your incision. Make sure you: °? Wash your hands with soap and water before you change your bandage (dressing). If soap and water are not available, use hand sanitizer. °? Change your dressing as told by your health care provider. °? Leave stitches (sutures), skin glue, or adhesive strips in place. These skin closures may need to stay in place for 2 weeks or longer. If adhesive strip edges start to loosen and curl up, you may trim the loose edges. Do not remove adhesive strips completely unless your health care provider tells you to do that. °· Check your incision area every day for signs of infection. Check for: °? Redness, swelling, or pain. °? Fluid or blood. °? Warmth. °? Pus or a bad smell. °Activity °· Do gentle, daily exercises as told by your health care provider. You may be told to take short walks every day and go farther each time. °· Do not lift anything that is heavier than 10 lb (4.5 kg), or the limit that your health care provider tells you, until he or she says that it is  safe. °· Do not drive or use heavy machinery while taking prescription pain medicine. °· Do not drive for 24 hours if you were given a medicine to help you relax (sedative). °· Follow your health care provider's instructions about exercise, driving, and general activities. Ask your health care provider what activities are safe for you. °Lifestyle °· Do not douche, use tampons, or have sex for at least 6 weeks or as told by your health care provider. °· Do not drink alcohol until your health care provider approves. °· Drink enough fluid to keep your urine clear or pale yellow. °· Try to have someone at home with you for the first 1-2 weeks to help. °· Do not use any products that contain nicotine or tobacco, such as cigarettes and e-cigarettes. These can delay healing. If you need help quitting, ask your health care provider. °General instructions °· Take over-the-counter and prescription medicines only as told by your health care provider. °· Do not take aspirin or ibuprofen. These medicines can cause bleeding. °· To prevent or treat constipation while you are taking prescription pain medicine, your health care provider may recommend that you: °? Drink enough fluid to keep your urine clear or pale yellow. °? Take over-the-counter or prescription medicines. °? Eat foods that are high in fiber, such as fresh fruits and vegetables, whole grains, and beans. °? Limit foods that are high in fat and processed sugars, such as fried and sweet foods. °· Keep all   follow-up visits as told by your health care provider. This is important. °Contact a health care provider if: °· You have chills or fever. °· You have redness, swelling, or pain around your incision. °· You have fluid or blood coming from your incision. °· Your incision feels warm to the touch. °· You have pus or a bad smell coming from your incision. °· Your incision breaks open. °· You feel dizzy or light-headed. °· You have pain or bleeding when you urinate. °· You  have persistent diarrhea. °· You have persistent nausea and vomiting. °· You have abnormal vaginal discharge. °· You have a rash. °· You have any type of abnormal reaction or you develop an allergy to your medicine. °· Your pain medicine does not help. °Get help right away if: °· You have a fever and your symptoms suddenly get worse. °· You have severe abdominal pain. °· You have shortness of breath. °· You faint. °· You have pain, swelling, or redness in your leg. °· You have heavy vaginal bleeding with blood clots. °Summary °· After your procedure, it is common to have pain, fatigue and vaginal discharge. °· Do not take baths, swim, or use a hot tub until your health care provider approves. Ask your health care provider if you can take showers. You may only be allowed to take sponge baths for bathing. °· Follow your health care provider's instructions about exercise, driving, and general activities. Ask your health care provider what activities are safe for you. °· Do not lift anything that is heavier than 10 lb (4.5 kg), or the limit that your health care provider tells you, until he or she says that it is safe. °· Try to have someone at home with you for the first 1-2 weeks to help. °This information is not intended to replace advice given to you by your health care provider. Make sure you discuss any questions you have with your health care provider. °Document Released: 02/06/2005 Document Revised: 07/08/2016 Document Reviewed: 07/08/2016 °Elsevier Interactive Patient Education © 2017 [employer] ° °

## 2018-02-10 NOTE — Progress Notes (Signed)
IV removed, 2x2 gauze and paper tape applied to site, patient tolerated well. Reviewed AVS with patient, who verbalized understanding.  Patient to be transported home by daughter.

## 2018-02-11 ENCOUNTER — Encounter (HOSPITAL_COMMUNITY): Payer: Self-pay | Admitting: Obstetrics & Gynecology

## 2018-02-16 DIAGNOSIS — Z029 Encounter for administrative examinations, unspecified: Secondary | ICD-10-CM

## 2018-02-16 LAB — TYPE AND SCREEN
ABO/RH(D): B POS
Antibody Screen: POSITIVE
PT AG TYPE: NEGATIVE
UNIT DIVISION: 0
Unit division: 0

## 2018-02-16 LAB — BPAM RBC
BLOOD PRODUCT EXPIRATION DATE: 201907252359
UNIT TYPE AND RH: 7300

## 2018-02-17 ENCOUNTER — Encounter: Payer: Self-pay | Admitting: Obstetrics & Gynecology

## 2018-02-17 ENCOUNTER — Ambulatory Visit (INDEPENDENT_AMBULATORY_CARE_PROVIDER_SITE_OTHER): Payer: BLUE CROSS/BLUE SHIELD | Admitting: Obstetrics & Gynecology

## 2018-02-17 VITALS — BP 92/61 | HR 110 | Ht 64.0 in | Wt 193.0 lb

## 2018-02-17 DIAGNOSIS — Z90711 Acquired absence of uterus with remaining cervical stump: Secondary | ICD-10-CM

## 2018-02-17 DIAGNOSIS — Z9889 Other specified postprocedural states: Secondary | ICD-10-CM

## 2018-02-17 MED ORDER — KETOROLAC TROMETHAMINE 10 MG PO TABS: 10.0000 mg | ORAL_TABLET | Freq: Three times a day (TID) | ORAL | 0 refills | Status: DC | PRN

## 2018-02-17 MED ORDER — OXYCODONE-ACETAMINOPHEN 5-325 MG PO TABS: 1.0000 | ORAL_TABLET | ORAL | 0 refills | Status: DC | PRN

## 2018-02-17 NOTE — Progress Notes (Signed)
  HPI: Patient returns for routine postoperative follow-up having undergone abdominal supracervical hysterectomy on 02/09/2018.  The patient's immediate postoperative recovery has been unremarkable. Since hospital discharge the patient reports no problems.   Current Outpatient Medications: albuterol (PROVENTIL) (2.5 MG/3ML) 0.083% nebulizer solution, Take 2.5 mg by nebulization every 6 (six) hours as needed for wheezing or shortness of breath., Disp: , Rfl:  ketorolac (TORADOL) 10 MG tablet, Take 1 tablet (10 mg total) by mouth every 8 (eight) hours as needed., Disp: 15 tablet, Rfl: 0 Multiple Vitamins-Minerals (HAIR SKIN AND NAILS FORMULA PO), Take 2 tablets by mouth daily., Disp: , Rfl:  ondansetron (ZOFRAN) 8 MG tablet, Take 1 tablet (8 mg total) by mouth every 6 (six) hours as needed for nausea., Disp: 20 tablet, Rfl: 0 oxyCODONE-acetaminophen (PERCOCET/ROXICET) 5-325 MG tablet, Take 1 tablet by mouth every 4 (four) hours as needed for moderate pain ((when tolerating fluids))., Disp: 30 tablet, Rfl: 0 PARoxetine (PAXIL) 20 MG tablet, Take 1 tablet (20 mg total) by mouth daily. (Patient taking differently: Take 20 mg by mouth every evening. ), Disp: 30 tablet, Rfl: 1 levocetirizine (XYZAL) 5 MG tablet, Take 5 mg by mouth daily as needed for allergies., Disp: , Rfl:  oxymetazoline (AFRIN) 0.05 % nasal spray, Place 1 spray into both nostrils daily as needed for congestion., Disp: , Rfl:  zolpidem (AMBIEN) 10 MG tablet, Take 1 tablet (10 mg total) by mouth at bedtime as needed for sleep. (Patient taking differently: Take 10 mg by mouth at bedtime. ), Disp: 30 tablet, Rfl: 3  No current facility-administered medications for this visit.     Blood pressure 92/61, pulse (!) 110, height 5\' 4"  (1.626 m), weight 193 lb (87.5 kg), last menstrual period 01/15/2018.  Physical Exam: Incision clean dry intact Abdomen is soft benign non tender  Diagnostic  Tests:   Pathology: benign  Impression: S/p abdominal supracervical hysterectomy  Plan:  Follow up: 4  weeks  [name], MD

## 2018-03-07 ENCOUNTER — Telehealth: Payer: Self-pay | Admitting: Obstetrics & Gynecology

## 2018-03-07 NOTE — Telephone Encounter (Signed)
Called patient back she stated that she is having pain near her ovaries.. Advised that she try ibuprofen for pain and if not better in a few days to call back. Patient agreeable and will give this a try.

## 2018-03-09 ENCOUNTER — Emergency Department (HOSPITAL_COMMUNITY): Payer: BLUE CROSS/BLUE SHIELD

## 2018-03-09 ENCOUNTER — Encounter (HOSPITAL_COMMUNITY): Payer: Self-pay | Admitting: Emergency Medicine

## 2018-03-09 ENCOUNTER — Emergency Department (HOSPITAL_COMMUNITY)
Admission: EM | Admit: 2018-03-09 | Discharge: 2018-03-09 | Disposition: A | Discharge status: Home / Self Care | Payer: BLUE CROSS/BLUE SHIELD | Attending: Emergency Medicine | Admitting: Emergency Medicine

## 2018-03-09 ENCOUNTER — Other Ambulatory Visit: Payer: Self-pay

## 2018-03-09 DIAGNOSIS — Z90711 Acquired absence of uterus with remaining cervical stump: Secondary | ICD-10-CM | POA: Insufficient documentation

## 2018-03-09 DIAGNOSIS — Z79899 Other long term (current) drug therapy: Secondary | ICD-10-CM | POA: Diagnosis not present

## 2018-03-09 DIAGNOSIS — R1030 Lower abdominal pain, unspecified: Secondary | ICD-10-CM | POA: Diagnosis not present

## 2018-03-09 DIAGNOSIS — J45909 Unspecified asthma, uncomplicated: Secondary | ICD-10-CM | POA: Insufficient documentation

## 2018-03-09 DIAGNOSIS — R1032 Left lower quadrant pain: Secondary | ICD-10-CM | POA: Diagnosis not present

## 2018-03-09 DIAGNOSIS — T889XXA Complication of surgical and medical care, unspecified, initial encounter: Secondary | ICD-10-CM

## 2018-03-09 DIAGNOSIS — R109 Unspecified abdominal pain: Secondary | ICD-10-CM | POA: Diagnosis not present

## 2018-03-09 DIAGNOSIS — G8918 Other acute postprocedural pain: Secondary | ICD-10-CM | POA: Diagnosis not present

## 2018-03-09 DIAGNOSIS — T819XXA Unspecified complication of procedure, initial encounter: Secondary | ICD-10-CM | POA: Diagnosis not present

## 2018-03-09 LAB — BASIC METABOLIC PANEL
ANION GAP: 6 (ref 5–15)
BUN: 7 mg/dL (ref 6–20)
CALCIUM: 9 mg/dL (ref 8.9–10.3)
CO2: 25 mmol/L (ref 22–32)
Chloride: 105 mmol/L (ref 98–111)
Creatinine, Ser: 0.73 mg/dL (ref 0.44–1.00)
GFR calc non Af Amer: >60 mL/min (ref >60)
Glucose, Bld: 90 mg/dL (ref 70–99)
Potassium: 4 mmol/L (ref 3.5–5.1)
Sodium: 136 mmol/L (ref 135–145)

## 2018-03-09 LAB — URINALYSIS, ROUTINE W REFLEX MICROSCOPIC
Glucose, UA: NEGATIVE mg/dL
KETONES UR: NEGATIVE mg/dL
Leukocytes, UA: NEGATIVE
Nitrite: NEGATIVE
PH: 6 (ref 5.0–8.0)
Protein, ur: NEGATIVE mg/dL
Specific Gravity, Urine: 1.021 (ref 1.005–1.030)

## 2018-03-09 LAB — CBC WITH DIFFERENTIAL/PLATELET
BASOS PCT: 0 %
Basophils Absolute: 0 10*3/uL (ref 0.0–0.1)
Eosinophils Absolute: 0.3 10*3/uL (ref 0.0–0.7)
Eosinophils Relative: 5 %
HCT: 36 % (ref 36.0–46.0)
HEMOGLOBIN: 11 g/dL — AB (ref 12.0–15.0)
Lymphocytes Relative: 39 %
Lymphs Abs: 2.5 10*3/uL (ref 0.7–4.0)
MCH: 25.6 pg — ABNORMAL LOW (ref 26.0–34.0)
MCHC: 30.6 g/dL (ref 30.0–36.0)
MCV: 83.7 fL (ref 78.0–100.0)
MONOS PCT: 7 %
Monocytes Absolute: 0.4 10*3/uL (ref 0.1–1.0)
Neutro Abs: 3.2 10*3/uL (ref 1.7–7.7)
Neutrophils Relative %: 49 %
Platelets: 318 10*3/uL (ref 150–400)
RBC: 4.3 MIL/uL (ref 3.87–5.11)
RDW: 14.9 % (ref 11.5–15.5)
WBC: 6.4 10*3/uL (ref 4.0–10.5)

## 2018-03-09 MED ORDER — POLYETHYLENE GLYCOL 3350 17 G PO PACK: 17.0000 g | PACK | Freq: Every day | ORAL | 0 refills | Status: DC

## 2018-03-09 MED ORDER — IOPAMIDOL (ISOVUE-300) INJECTION 61%
100.0000 mL | Freq: Once | INTRAVENOUS | Status: AC | PRN
  Administered 2018-03-09: 100 mL via INTRAVENOUS

## 2018-03-09 MED ORDER — FENTANYL CITRATE (PF) 100 MCG/2ML IJ SOLN
50.0000 ug | Freq: Once | INTRAMUSCULAR | Status: AC
  Administered 2018-03-09: 50 ug via INTRAVENOUS
  Filled 2018-03-09: qty 2

## 2018-03-09 MED ORDER — OXYCODONE-ACETAMINOPHEN 5-325 MG PO TABS: 1.0000 | ORAL_TABLET | Freq: Four times a day (QID) | ORAL | 0 refills | Status: DC | PRN

## 2018-03-09 NOTE — ED Provider Notes (Signed)
[facility] EMERGENCY DEPARTMENT Provider Note   CSN: [id number] Arrival date & time: 03/09/18  0857     History   Chief Complaint Chief Complaint  Patient presents with  . Abdominal Pain    HPI Rachel Jefferson is a 37 y.o. female.  HPI Patient presents with lower abdominal pain for the last 2 days.  Left lower quadrant states it does go down into her groin.  Had a partial hysterectomy around 1 month ago.  Has had some constipation which is not unusual for her.  Has been taking some pain medicines for the pain over the last couple days but had decreased pain meds and decreased pain after the surgery.  States she has some burning when she urinates.  No fevers or chills.  No cough.  Patient states the pain is sharp and is worse with certain movements. Past Medical History:  Diagnosis Date  . Anxiety   . Asthma   . Bronchitis   . Dyspnea   . GERD (gastroesophageal reflux disease)     Patient Active Problem List   Diagnosis Date Noted  . S/P abdominal supracervical subtotal hysterectomy 02/09/2018    Past Surgical History:  Procedure Laterality Date  . BILATERAL SALPINGECTOMY Bilateral 02/09/2018   Procedure: BILATERAL SALPINGECTOMY;  Surgeon: [name], MD;  Location: [facility];  Service: Gynecology;  Laterality: Bilateral;  . SUPRACERVICAL ABDOMINAL HYSTERECTOMY N/A 02/09/2018   Procedure: HYSTERECTOMY SUPRACERVICAL ABDOMINAL;  Surgeon: [name], MD;  Location: [facility];  Service: Gynecology;  Laterality: N/A;  . TUBAL LIGATION    . tubes tied       OB History    Gravida  5   Para  3   Term      Preterm      AB  2   Living  3     SAB      TAB  2   Ectopic      Multiple      Live Births               Home Medications    Prior to Admission medications   Medication Sig Start Date End Date Taking? Authorizing Provider  albuterol (PROVENTIL) (2.5 MG/3ML) 0.083% nebulizer solution Take 2.5 mg by nebulization every 6 (six) hours as needed  for wheezing or shortness of breath.   Yes [provider]  ketorolac (TORADOL) 10 MG tablet Take 1 tablet (10 mg total) by mouth every 8 (eight) hours as needed. 02/17/18  Yes [name], MD  Multiple Vitamins-Minerals (HAIR SKIN AND NAILS FORMULA PO) Take 2 tablets by mouth daily.   Yes [provider]  oxymetazoline (AFRIN) 0.05 % nasal spray Place 1 spray into both nostrils daily as needed for congestion.   Yes [provider]  PARoxetine (PAXIL) 20 MG tablet Take 1 tablet (20 mg total) by mouth daily. Patient taking differently: Take 20 mg by mouth every evening.  01/13/18  Yes [name], MD  zolpidem (AMBIEN) 10 MG tablet Take 1 tablet (10 mg total) by mouth at bedtime as needed for sleep. Patient taking differently: Take 10 mg by mouth at bedtime.  01/13/18 03/09/18 Yes [name], MD  ondansetron (ZOFRAN) 8 MG tablet Take 1 tablet (8 mg total) by mouth every 6 (six) hours as needed for nausea. Patient not taking: Reported on 03/09/2018 02/10/18   [name], MD  oxyCODONE-acetaminophen (PERCOCET/ROXICET) 5-325 MG tablet Take 1-2 tablets by mouth every  6 (six) hours as needed for moderate pain ((when tolerating fluids)). 03/09/18   Davonna Belling, MD  polyethylene glycol [facility] / Floria Raveling) packet Take 17 g by mouth daily. 03/09/18   Davonna Belling, MD    Family History Family History  Problem Relation Age of Onset  . Heart attack Paternal Grandfather   . Diabetes Paternal Grandmother   . Hypertension Mother     Social History Social History   Tobacco Use  . Smoking status: Never Smoker  . Smokeless tobacco: Never Used  Substance Use Topics  . Alcohol use: No  . Drug use: No     Allergies   Penicillins   Review of Systems Review of Systems  Constitutional: Negative for appetite change.  HENT: Negative for congestion.   Respiratory: Negative for shortness of breath.   Cardiovascular: Negative for chest pain.  Gastrointestinal:  Positive for abdominal pain and constipation. Negative for diarrhea, nausea and vomiting.  Genitourinary: Positive for dysuria.  Skin: Negative for pallor.  Neurological: Negative for weakness.  Hematological: Negative for adenopathy.  Psychiatric/Behavioral: Negative for confusion.     Physical Exam Updated Vital Signs BP 117/76 (BP Location: Left Arm)   Pulse 79   Temp 98.4 F (36.9 C) (Oral)   Resp 18   LMP 01/15/2018   SpO2 100%   Physical Exam  Constitutional: She appears well-developed.  HENT:  Head: Atraumatic.  Eyes: Pupils are equal, round, and reactive to light.  Cardiovascular: Regular rhythm.  Pulmonary/Chest: Breath sounds normal.  Abdominal:  Well-healing suprapubic surgical site.  Suprapubic to left lower quadrant tenderness.  No hernia palpated.  No rebound or guarding.  Neurological: She is alert.  Skin: Skin is warm. Capillary refill takes less than 2 seconds.     ED Treatments / Results  Labs (all labs ordered are listed, but only abnormal results are displayed) Labs Reviewed  URINALYSIS, ROUTINE W REFLEX MICROSCOPIC - Abnormal; Notable for the following components:      Result Value   APPearance HAZY (*)    Hgb urine dipstick SMALL (*)    Bilirubin Urine SMALL (*)    Bacteria, UA RARE (*)    All other components within normal limits  CBC WITH DIFFERENTIAL/PLATELET - Abnormal; Notable for the following components:   Hemoglobin 11.0 (*)    MCH 25.6 (*)    All other components within normal limits  BASIC METABOLIC PANEL    EKG None  Radiology Ct Abdomen Pelvis W Contrast  Result Date: 03/09/2018 CLINICAL DATA:  Left lower quadrant abdomen pain with intermittent dysuria.Patient status post bilateral salpingectomy and abdominal hysterectomy February 09, 2018. EXAM: CT ABDOMEN AND PELVIS WITH CONTRAST TECHNIQUE: Multidetector CT imaging of the abdomen and pelvis was performed using the standard protocol following bolus administration of intravenous  contrast. CONTRAST:  131mL ISOVUE-300 IOPAMIDOL (ISOVUE-300) INJECTION 61% COMPARISON:  Dec 17, 2015 FINDINGS: Lower chest: No acute abnormality. Hepatobiliary: No focal liver abnormality is seen. No gallstones, gallbladder wall thickening, or biliary dilatation. Pancreas: Unremarkable. No pancreatic ductal dilatation or surrounding inflammatory changes. Spleen: Normal in size without focal abnormality. Adrenals/Urinary Tract: Adrenal glands are unremarkable. There is no hydronephrosis bilaterally. No focal kidney lesion is identified bilaterally. There is a question 1 mm nonobstructing stone in the lower pole right kidney. Bladder is unremarkable. Stomach/Bowel: Stomach is within normal limits. Appendix appears normal. No evidence of bowel wall thickening, distention, or inflammatory changes. Vascular/Lymphatic: No significant vascular findings are present. No enlarged abdominal or pelvic lymph nodes. Reproductive: Patient status  post bilateral salpingectomy and abdominal hysterectomy February 09, 2018. There is a complex cystic lesion in the midline pelvis/surgical bed measuring 5.2 x 7.1 x 3.7 cm. There are simple cysts in bilateral ovaries. Minimal amount of free fluid is identified in the pelvis. Other: None. Musculoskeletal: No acute or significant osseous findings. IMPRESSION: There is a complex cystic lesion in the midline pelvis/surgical bed measuring 5.2 x 7.1 x 3.7 cm, suspicious for abscess. Electronically Signed   By: Abelardo Diesel M.D.   On: 03/09/2018 11:44   Dg Abd 2 Views  Result Date: 03/09/2018 CLINICAL DATA:  Left lower abdominal pain for the past 4 days. No nausea or bowel changes. EXAM: ABDOMEN - 2 VIEW COMPARISON:  Abdominal and pelvic CT scan of Dec 17, 2015 FINDINGS: The colonic stool burden is moderate. There is no small or or large bowel obstructive pattern. There is no evidence of a fecal impaction. There are no abnormal soft tissue calcifications. There are pelvic phleboliths. The bony  structures are unremarkable. IMPRESSION: Mildly increase colonic stool burden may reflect constipation in the appropriate clinical setting. No evidence of obstruction or perforation. Electronically Signed   By: David  Martinique M.D.   On: 03/09/2018 10:25    Procedures Procedures (including critical care time)  Medications Ordered in ED Medications  iopamidol (ISOVUE-300) 61 % injection 100 mL (100 mLs Intravenous Contrast Given 03/09/18 1122)  fentaNYL (SUBLIMAZE) injection 50 mcg (50 mcg Intravenous Given 03/09/18 1220)     Initial Impression / Assessment and Plan / ED Course  I have reviewed the triage vital signs and the nursing notes.  Pertinent labs & imaging results that were available during my care of the patient were reviewed by me and considered in my medical decision making (see chart for details).     Patient with abdominal pain.  Lower abdomen around 1 month post hysterectomy.  CT scan done and showed fluid collection.  Complex.  Abscess considered but with normal white count and no fever it is felt less likely.  May be hematoma.  Discussed with Dr. Glo Herring.  Will see Dr. Elonda Husky on Monday at 67.  If pain increases or more lightheadedness or dizziness will return.  Also fevers will cause her to return.  Final Clinical Impressions(s) / ED Diagnoses   Final diagnoses:  Lower abdominal pain  Complication of surgical and medical care, initial encounter    ED Discharge Orders        Ordered    oxyCODONE-acetaminophen (PERCOCET/ROXICET) 5-325 MG tablet  Every 6 hours PRN     03/09/18 1254    polyethylene glycol (MIRALAX / GLYCOLAX) packet  Daily     03/09/18 1256       Davonna Belling, MD 03/09/18 1312

## 2018-03-09 NOTE — Discharge Instructions (Signed)
You have likely blood in your abdomen from the surgery.  Less likely infection.  Return for increasing pain lightheadedness dizziness or fevers.  Follow-up on Monday with Dr. Elonda Husky

## 2018-03-09 NOTE — ED Notes (Signed)
Pt taken to xray 

## 2018-03-09 NOTE — ED Triage Notes (Signed)
Pt c/o LLQ pain radiating into left inner thigh with intermittent dysuria. Denies vag dc. C/o constipation. Nad. lnbm one week ago. Had partial hys July 10 and has been using suppositories. Denies n/v

## 2018-03-14 ENCOUNTER — Encounter: Payer: Self-pay | Admitting: Obstetrics & Gynecology

## 2018-03-14 ENCOUNTER — Ambulatory Visit (INDEPENDENT_AMBULATORY_CARE_PROVIDER_SITE_OTHER): Payer: BLUE CROSS/BLUE SHIELD | Admitting: Obstetrics & Gynecology

## 2018-03-14 ENCOUNTER — Other Ambulatory Visit: Payer: Self-pay

## 2018-03-14 VITALS — BP 115/76 | HR 93 | Ht 64.0 in | Wt 196.0 lb

## 2018-03-14 DIAGNOSIS — Z90711 Acquired absence of uterus with remaining cervical stump: Secondary | ICD-10-CM

## 2018-03-14 NOTE — Progress Notes (Signed)
  HPI: Patient returns for routine postoperative follow-up having undergone supracervical hysterectomy  on 02/09/2018.  The patient's immediate postoperative recovery has been unremarkable. Since hospital discharge the patient reports resolved pain after relief of constipation.   Current Outpatient Medications: albuterol (PROVENTIL) (2.5 MG/3ML) 0.083% nebulizer solution, Take 2.5 mg by nebulization every 6 (six) hours as needed for wheezing or shortness of breath., Disp: , Rfl:  Multiple Vitamins-Minerals (HAIR SKIN AND NAILS FORMULA PO), Take 2 tablets by mouth daily., Disp: , Rfl:  ondansetron (ZOFRAN) 8 MG tablet, Take 1 tablet (8 mg total) by mouth every 6 (six) hours as needed for nausea., Disp: 20 tablet, Rfl: 0 oxyCODONE-acetaminophen (PERCOCET/ROXICET) 5-325 MG tablet, Take 1-2 tablets by mouth every 6 (six) hours as needed for moderate pain ((when tolerating fluids))., Disp: 10 tablet, Rfl: 0 oxymetazoline (AFRIN) 0.05 % nasal spray, Place 1 spray into both nostrils daily as needed for congestion., Disp: , Rfl:  PARoxetine (PAXIL) 20 MG tablet, Take 1 tablet (20 mg total) by mouth daily. (Patient taking differently: Take 20 mg by mouth every evening. ), Disp: 30 tablet, Rfl: 1 polyethylene glycol (MIRALAX / GLYCOLAX) packet, Take 17 g by mouth daily., Disp: 14 each, Rfl: 0 zolpidem (AMBIEN) 10 MG tablet, Take 1 tablet (10 mg total) by mouth at bedtime as needed for sleep. (Patient taking differently: Take 10 mg by mouth at bedtime. ), Disp: 30 tablet, Rfl: 3  No current facility-administered medications for this visit.     Blood pressure 115/76, pulse 93, height 5\' 4"  (1.626 m), weight 196 lb (88.9 kg), last menstrual period 01/15/2018.  Physical Exam: Incision clean dry intact Abdomen soft non tender  Diagnostic Tests:   Pathology: benign  Impression: S/p supracervical hysterectomy  Plan:   Follow up: 1  years  [name], MD

## 2018-03-17 ENCOUNTER — Encounter: Payer: BLUE CROSS/BLUE SHIELD | Admitting: Obstetrics & Gynecology

## 2018-04-28 ENCOUNTER — Encounter: Payer: Self-pay | Admitting: Obstetrics & Gynecology

## 2018-04-28 ENCOUNTER — Other Ambulatory Visit: Payer: Self-pay

## 2018-04-28 ENCOUNTER — Ambulatory Visit (INDEPENDENT_AMBULATORY_CARE_PROVIDER_SITE_OTHER): Payer: BLUE CROSS/BLUE SHIELD | Admitting: Obstetrics & Gynecology

## 2018-04-28 VITALS — BP 130/86 | HR 96 | Ht 64.0 in | Wt 200.0 lb

## 2018-04-28 DIAGNOSIS — A599 Trichomoniasis, unspecified: Secondary | ICD-10-CM | POA: Diagnosis not present

## 2018-04-28 MED ORDER — METRONIDAZOLE 500 MG PO TABS: 500.0000 mg | ORAL_TABLET | Freq: Two times a day (BID) | ORAL | 1 refills | Status: DC

## 2018-04-28 NOTE — Progress Notes (Signed)
       Chief Complaint  Patient presents with  . Vaginal Discharge    bleeding after intercourse    Blood pressure 130/86, pulse 96, height 5\' 4"  (1.626 m), weight 200 lb (90.7 kg), last menstrual period 01/15/2018.  37 y.o. [id number] Patient's last menstrual period was 01/15/2018. The current method of family planning is status post hysterectomy.  Subjective Vaginal discharge for 2weeks + postcoital bleeding Itching no Irritation no Odor yes Similar to previous yes  Previous treatment flagyl  Objective Vulva:  normal appearing vulva with no masses, tenderness or lesions Vagina:  normal mucosa, thin grey discharge Cervix:  no cervical motion tenderness and no lesions Uterus:  uterus absent Adnexa: ovaries:,       Pertinent ROS No burning with urination, frequency or urgency No nausea, vomiting or diarrhea Nor fever chills or other constitutional symptoms   Labs or studies Wet Prep:   A sample of vaginal discharge was obtained from the posterior fornix using a cotton swab. 2 drops of saline were placed on a slide and the cotton swab was immersed in the saline. Microscopic evaluation was performed and results were as follows:  Negative  for yeast  Negative for clue cells , consistent with Bacterial vaginosis Positive for trichomonas  Normal WBC population   Whiff test: Positive     Impression Diagnoses this Encounter::   ICD-10-CM   1. Trichomonas vaginalis infection A59.9     Established relevant diagnosis(es):   Plan/Recommendations: Meds ordered this encounter  Medications  . metroNIDAZOLE (FLAGYL) 500 MG tablet    Sig: Take 1 tablet (500 mg total) by mouth 2 (two) times daily.    Dispense:  14 tablet    Refill:  1    Please give refill for sexual partner    Labs or Scans Ordered: No orders of the defined types were placed in this encounter.   Management:: Metronidazole 500 BID for pt and partner TOC 3 weeks  Follow up Return in about 3  weeks (around 05/19/2018) for Follow up, with Dr Elonda Husky.       All questions were answered.

## 2018-05-05 ENCOUNTER — Ambulatory Visit: Payer: BLUE CROSS/BLUE SHIELD | Admitting: Obstetrics & Gynecology

## 2018-05-17 ENCOUNTER — Other Ambulatory Visit: Payer: Self-pay

## 2018-05-17 ENCOUNTER — Encounter: Payer: Self-pay | Admitting: Obstetrics & Gynecology

## 2018-05-17 ENCOUNTER — Ambulatory Visit (INDEPENDENT_AMBULATORY_CARE_PROVIDER_SITE_OTHER): Payer: BLUE CROSS/BLUE SHIELD | Admitting: Obstetrics & Gynecology

## 2018-05-17 VITALS — BP 120/81 | HR 95 | Ht 64.0 in | Wt 201.0 lb

## 2018-05-17 DIAGNOSIS — Z8619 Personal history of other infectious and parasitic diseases: Secondary | ICD-10-CM

## 2018-05-17 DIAGNOSIS — Z09 Encounter for follow-up examination after completed treatment for conditions other than malignant neoplasm: Secondary | ICD-10-CM | POA: Diagnosis not present

## 2018-05-17 DIAGNOSIS — B373 Candidiasis of vulva and vagina: Secondary | ICD-10-CM

## 2018-05-17 DIAGNOSIS — A599 Trichomoniasis, unspecified: Secondary | ICD-10-CM

## 2018-05-17 DIAGNOSIS — B3731 Acute candidiasis of vulva and vagina: Secondary | ICD-10-CM

## 2018-05-17 MED ORDER — TERCONAZOLE 0.4 % VA CREA: 1.0000 | TOPICAL_CREAM | Freq: Every day | VAGINAL | 0 refills | Status: DC

## 2018-05-17 NOTE — Progress Notes (Signed)
       Chief Complaint  Patient presents with  . POC    trich    Blood pressure 120/81, pulse 95, height 5\' 4"  (1.626 m), weight 201 lb (91.2 kg), last menstrual period 01/15/2018.  37 y.o. [id number] Patient's last menstrual period was 01/15/2018. The current method of family planning is hysterectomy.  Subjective:  POC visit for trichomonas Vaginal discharge for  Itching no Irritation no Odor no Similar to previous no  Previous treatment metronidazole  Objective Vulva:  normal appearing vulva with no masses, tenderness or lesions Vagina:  normal mucosa, curd-like discharge Cervix:  no cervical motion tenderness and no lesions Uterus:   Adnexa: ovaries:,       Pertinent ROS No burning with urination, frequency or urgency No nausea, vomiting or diarrhea Nor fever chills or other constitutional symptoms   Labs or studies Wet Prep:   A sample of vaginal discharge was obtained from the posterior fornix using a cotton swab. 2 drops of saline were placed on a slide and the cotton swab was immersed in the saline. Microscopic evaluation was performed and results were as follows:  Positive  for yeast  Negative for clue cells , consistent with Bacterial vaginosis Negative for trichomonas  Normal WBC population   Whiff test: Negative     Impression Diagnoses this Encounter::   ICD-10-CM   1. Trichomonas vaginalis infection A59.9    resolved s/p metronidazole  2. Yeast vaginitis B37.3     Established relevant diagnosis(es):   Plan/Recommendations: Meds ordered this encounter  Medications  . terconazole (TERAZOL 7) 0.4 % vaginal cream    Sig: Place 1 applicator vaginally at bedtime.    Dispense:  45 g    Refill:  0    Labs or Scans Ordered: No orders of the defined types were placed in this encounter.   Management:: No trichomonas seen + yeast s/p oral metronidazole  Follow up Return if symptoms worsen or fail to improve, for Follow up, with Dr  Elonda Husky.         All questions were answered.

## 2018-05-18 ENCOUNTER — Encounter: Payer: Self-pay | Admitting: Obstetrics & Gynecology

## 2018-06-07 DIAGNOSIS — J301 Allergic rhinitis due to pollen: Secondary | ICD-10-CM | POA: Diagnosis not present

## 2018-06-07 DIAGNOSIS — J45901 Unspecified asthma with (acute) exacerbation: Secondary | ICD-10-CM | POA: Diagnosis not present

## 2018-06-09 ENCOUNTER — Encounter (HOSPITAL_COMMUNITY): Payer: Self-pay

## 2018-06-09 ENCOUNTER — Emergency Department (HOSPITAL_COMMUNITY)
Admission: EM | Admit: 2018-06-09 | Discharge: 2018-06-09 | Disposition: A | Discharge status: Home / Self Care | Payer: BLUE CROSS/BLUE SHIELD | Attending: Emergency Medicine | Admitting: Emergency Medicine

## 2018-06-09 ENCOUNTER — Other Ambulatory Visit: Payer: Self-pay

## 2018-06-09 DIAGNOSIS — Y999 Unspecified external cause status: Secondary | ICD-10-CM | POA: Diagnosis not present

## 2018-06-09 DIAGNOSIS — T189XXA Foreign body of alimentary tract, part unspecified, initial encounter: Secondary | ICD-10-CM | POA: Diagnosis not present

## 2018-06-09 DIAGNOSIS — K219 Gastro-esophageal reflux disease without esophagitis: Secondary | ICD-10-CM | POA: Diagnosis not present

## 2018-06-09 DIAGNOSIS — Y9389 Activity, other specified: Secondary | ICD-10-CM | POA: Insufficient documentation

## 2018-06-09 DIAGNOSIS — Z79899 Other long term (current) drug therapy: Secondary | ICD-10-CM | POA: Insufficient documentation

## 2018-06-09 DIAGNOSIS — J45909 Unspecified asthma, uncomplicated: Secondary | ICD-10-CM | POA: Diagnosis not present

## 2018-06-09 DIAGNOSIS — X58XXXA Exposure to other specified factors, initial encounter: Secondary | ICD-10-CM | POA: Diagnosis not present

## 2018-06-09 DIAGNOSIS — K222 Esophageal obstruction: Secondary | ICD-10-CM | POA: Diagnosis not present

## 2018-06-09 DIAGNOSIS — T18120A Food in esophagus causing compression of trachea, initial encounter: Secondary | ICD-10-CM | POA: Diagnosis not present

## 2018-06-09 DIAGNOSIS — Y929 Unspecified place or not applicable: Secondary | ICD-10-CM | POA: Diagnosis not present

## 2018-06-09 DIAGNOSIS — T18128A Food in esophagus causing other injury, initial encounter: Secondary | ICD-10-CM | POA: Insufficient documentation

## 2018-06-09 LAB — CBC WITH DIFFERENTIAL/PLATELET
Abs Immature Granulocytes: 0.03 10*3/uL (ref 0.00–0.07)
BASOS ABS: 0.1 10*3/uL (ref 0.0–0.1)
Basophils Relative: 1 %
EOS PCT: 2 %
Eosinophils Absolute: 0.2 10*3/uL (ref 0.0–0.5)
HCT: 42.1 % (ref 36.0–46.0)
HEMOGLOBIN: 12.6 g/dL (ref 12.0–15.0)
Immature Granulocytes: 0 %
LYMPHS PCT: 31 %
Lymphs Abs: 3.6 10*3/uL (ref 0.7–4.0)
MCH: 23.6 pg — ABNORMAL LOW (ref 26.0–34.0)
MCHC: 29.9 g/dL — AB (ref 30.0–36.0)
MCV: 79 fL — ABNORMAL LOW (ref 80.0–100.0)
Monocytes Absolute: 0.9 10*3/uL (ref 0.1–1.0)
Monocytes Relative: 8 %
NEUTROS ABS: 6.6 10*3/uL (ref 1.7–7.7)
NRBC: 0 % (ref 0.0–0.2)
Neutrophils Relative %: 58 %
Platelets: 387 10*3/uL (ref 150–400)
RBC: 5.33 MIL/uL — AB (ref 3.87–5.11)
RDW: 15.8 % — AB (ref 11.5–15.5)
WBC: 11.4 10*3/uL — AB (ref 4.0–10.5)

## 2018-06-09 LAB — BASIC METABOLIC PANEL
ANION GAP: 9 (ref 5–15)
BUN: 16 mg/dL (ref 6–20)
CHLORIDE: 108 mmol/L (ref 98–111)
CO2: 22 mmol/L (ref 22–32)
CREATININE: 0.79 mg/dL (ref 0.44–1.00)
Calcium: 8.8 mg/dL — ABNORMAL LOW (ref 8.9–10.3)
GFR calc non Af Amer: >60 mL/min (ref >60)
Glucose, Bld: 149 mg/dL — ABNORMAL HIGH (ref 70–99)
POTASSIUM: 3.2 mmol/L — AB (ref 3.5–5.1)
SODIUM: 139 mmol/L (ref 135–145)

## 2018-06-09 MED ORDER — SODIUM CHLORIDE 0.9 % IV BOLUS
1000.0000 mL | Freq: Once | INTRAVENOUS | Status: AC
  Administered 2018-06-09: 1000 mL via INTRAVENOUS

## 2018-06-09 MED ORDER — ALUM & MAG HYDROXIDE-SIMETH 200-200-20 MG/5ML PO SUSP
30.0000 mL | Freq: Once | ORAL | Status: AC
  Administered 2018-06-09: 30 mL via ORAL
  Filled 2018-06-09: qty 30

## 2018-06-09 MED ORDER — LIDOCAINE VISCOUS HCL 2 % MT SOLN
15.0000 mL | Freq: Once | OROMUCOSAL | Status: AC
  Administered 2018-06-09: 15 mL via ORAL
  Filled 2018-06-09: qty 15

## 2018-06-09 MED ORDER — ONDANSETRON HCL 4 MG/2ML IJ SOLN
4.0000 mg | Freq: Once | INTRAMUSCULAR | Status: AC
  Administered 2018-06-09: 4 mg via INTRAVENOUS
  Filled 2018-06-09: qty 2

## 2018-06-09 MED ORDER — OMEPRAZOLE 20 MG PO CPDR: 20.0000 mg | DELAYED_RELEASE_CAPSULE | Freq: Every day | ORAL | 0 refills | Status: DC

## 2018-06-09 MED ORDER — GLUCAGON HCL RDNA (DIAGNOSTIC) 1 MG IJ SOLR
1.0000 mg | Freq: Once | INTRAMUSCULAR | Status: AC
  Administered 2018-06-09: 1 mg via INTRAVENOUS
  Filled 2018-06-09: qty 1

## 2018-06-09 NOTE — ED Triage Notes (Signed)
Pt was eating steak when she felt a piece get stuck in her throat.  Pt currently heaving, has happened before per pt

## 2018-06-09 NOTE — ED Notes (Signed)
Patient able to swallow water and keep it down with no problems. Patient states that she vomited up earlier and feels like she vomited up the foreign body.

## 2018-06-09 NOTE — ED Provider Notes (Signed)
[facility] EMERGENCY DEPARTMENT Provider Note   CSN: [id number] Arrival date & time: 06/09/18  2115     History   Chief Complaint Chief Complaint  Patient presents with  . Swallowed Foreign Body    HPI Rachel Jefferson is a 37 y.o. female.  Pt presents to the ED today with steak stuck in her throat.  The pt said this happened last week, but she was able to clear it.  Steak has been stuck for about 1.5 hours and she can't get it to go down or come up.  She has never seen a GI doctor for these sx.  She does have a hx of GERD.     Past Medical History:  Diagnosis Date  . Anxiety   . Asthma   . Bronchitis   . Dyspnea   . GERD (gastroesophageal reflux disease)   . Trichimoniasis     Patient Active Problem List   Diagnosis Date Noted  . S/P abdominal supracervical subtotal hysterectomy 02/09/2018    Past Surgical History:  Procedure Laterality Date  . BILATERAL SALPINGECTOMY Bilateral 02/09/2018   Procedure: BILATERAL SALPINGECTOMY;  Surgeon: [name], MD;  Location: [facility];  Service: Gynecology;  Laterality: Bilateral;  . SUPRACERVICAL ABDOMINAL HYSTERECTOMY N/A 02/09/2018   Procedure: HYSTERECTOMY SUPRACERVICAL ABDOMINAL;  Surgeon: [name], MD;  Location: [facility];  Service: Gynecology;  Laterality: N/A;  . TUBAL LIGATION    . tubes tied       OB History    Gravida  5   Para  3   Term      Preterm      AB  2   Living  3     SAB      TAB  2   Ectopic      Multiple      Live Births               Home Medications    Prior to Admission medications   Medication Sig Start Date End Date Taking? Authorizing Provider  albuterol (PROVENTIL) (2.5 MG/3ML) 0.083% nebulizer solution Take 2.5 mg by nebulization every 6 (six) hours as needed for wheezing or shortness of breath.   Yes [provider]  fluticasone (FLONASE) 50 MCG/ACT nasal spray Place 2 sprays into both nostrils daily.  06/07/18  Yes [provider]    Multiple Vitamins-Minerals (HAIR SKIN AND NAILS FORMULA PO) Take 2 tablets by mouth daily.   Yes [provider]  oxymetazoline (AFRIN) 0.05 % nasal spray Place 1 spray into both nostrils daily as needed for congestion.   Yes [provider]  predniSONE (STERAPRED UNI-PAK 21 TAB) 5 MG (21) TBPK tablet Take 5-30 mg by mouth See admin instructions. 6 day course starting on 06/07/2018 06/07/18  Yes [provider]  VENTOLIN HFA 108 (90 Base) MCG/ACT inhaler Inhale 2 puffs into the lungs every 4 (four) hours as needed for wheezing or shortness of breath.  06/07/18  Yes [provider]  omeprazole (PRILOSEC) 20 MG capsule Take 1 capsule (20 mg total) by mouth daily. 06/09/18   Isla Pence, MD  terconazole (TERAZOL 7) 0.4 % vaginal cream Place 1 applicator vaginally at bedtime. Patient not taking: Reported on 06/09/2018 05/17/18   [name], MD  zolpidem (AMBIEN) 10 MG tablet Take 1 tablet (10 mg total) by mouth at bedtime as needed for sleep. Patient taking differently: Take 10 mg by mouth at bedtime.  01/13/18 03/09/18  Tania Ade  H, MD    Family History Family History  Problem Relation Age of Onset  . Heart attack Paternal Grandfather   . Diabetes Paternal Grandmother   . Hypertension Mother     Social History Social History   Tobacco Use  . Smoking status: Never Smoker  . Smokeless tobacco: Never Used  Substance Use Topics  . Alcohol use: No  . Drug use: No     Allergies   Penicillins   Review of Systems Review of Systems  Gastrointestinal:       Unable to swallow  All other systems reviewed and are negative.    Physical Exam Updated Vital Signs BP (!) 117/91   Pulse 69   Temp 98.3 F (36.8 C) (Oral)   Resp 18   Ht 5\' 5"  (1.651 m)   Wt 91.2 kg   LMP 01/15/2018   SpO2 95%   BMI 33.46 kg/m   Physical Exam  Constitutional: She is oriented to person, place, and time. She appears well-developed. She appears distressed.   HENT:  Head: Normocephalic and atraumatic.  Right Ear: External ear normal.  Left Ear: External ear normal.  Nose: Nose normal.  Mouth/Throat: Oropharynx is clear and moist.  Eyes: Pupils are equal, round, and reactive to light. Conjunctivae and EOM are normal.  Neck: Normal range of motion. Neck supple.  Cardiovascular: Normal rate, regular rhythm, normal heart sounds and intact distal pulses.  Pulmonary/Chest: Effort normal and breath sounds normal.  Abdominal: Soft. Bowel sounds are normal.  Musculoskeletal: Normal range of motion.  Neurological: She is alert and oriented to person, place, and time.  Skin: Skin is warm. Capillary refill takes less than 2 seconds.  Psychiatric: She has a normal mood and affect. Her behavior is normal. Judgment and thought content normal.  Nursing note and vitals reviewed.    ED Treatments / Results  Labs (all labs ordered are listed, but only abnormal results are displayed) Labs Reviewed  BASIC METABOLIC PANEL - Abnormal; Notable for the following components:      Result Value   Potassium 3.2 (*)    Glucose, Bld 149 (*)    Calcium 8.8 (*)    All other components within normal limits  CBC WITH DIFFERENTIAL/PLATELET - Abnormal; Notable for the following components:   WBC 11.4 (*)    RBC 5.33 (*)    MCV 79.0 (*)    MCH 23.6 (*)    MCHC 29.9 (*)    RDW 15.8 (*)    All other components within normal limits    EKG None  Radiology No results found.  Procedures Procedures (including critical care time)  Medications Ordered in ED Medications  glucagon (human recombinant) (GLUCAGEN) injection 1 mg (1 mg Intravenous Given 06/09/18 2135)  ondansetron (ZOFRAN) injection 4 mg (4 mg Intravenous Given 06/09/18 2134)  sodium chloride 0.9 % bolus 1,000 mL (0 mLs Intravenous Stopped 06/09/18 2258)  alum & mag hydroxide-simeth (MAALOX/MYLANTA) 200-200-20 MG/5ML suspension 30 mL (30 mLs Oral Given 06/09/18 2220)    And  lidocaine (XYLOCAINE) 2 %  viscous mouth solution 15 mL (15 mLs Oral Given 06/09/18 2221)     Initial Impression / Assessment and Plan / ED Course  I have reviewed the triage vital signs and the nursing notes.  Pertinent labs & imaging results that were available during my care of the patient were reviewed by me and considered in my medical decision making (see chart for details).    Unfortunately, pt's sx did not resolve  with glucagon.  She was d/w Dr. Gala Romney (GI) who will come and remove food bolus.  Shortly after I called Dr. Gala Romney, pt vomited and cleared her esophageal bolus.  She was able to swallow water after that.  Pt to f/u with Dr. Gala Romney as an outpatient.  She is started on prilosec and encouraged to chew her food.  Return if worse.  Final Clinical Impressions(s) / ED Diagnoses   Final diagnoses:  Esophageal obstruction due to food impaction  Gastroesophageal reflux disease, esophagitis presence not specified    ED Discharge Orders         Ordered    omeprazole (PRILOSEC) 20 MG capsule  Daily     06/09/18 2258           Isla Pence, MD 06/09/18 2319

## 2018-08-07 ENCOUNTER — Encounter (HOSPITAL_COMMUNITY): Payer: Self-pay | Admitting: Emergency Medicine

## 2018-08-07 ENCOUNTER — Emergency Department (HOSPITAL_COMMUNITY)
Admission: EM | Admit: 2018-08-07 | Discharge: 2018-08-07 | Disposition: A | Discharge status: Home / Self Care | Payer: BLUE CROSS/BLUE SHIELD | Attending: Emergency Medicine | Admitting: Emergency Medicine

## 2018-08-07 ENCOUNTER — Other Ambulatory Visit: Payer: Self-pay

## 2018-08-07 DIAGNOSIS — N3001 Acute cystitis with hematuria: Secondary | ICD-10-CM | POA: Insufficient documentation

## 2018-08-07 DIAGNOSIS — Z79899 Other long term (current) drug therapy: Secondary | ICD-10-CM | POA: Diagnosis not present

## 2018-08-07 DIAGNOSIS — R319 Hematuria, unspecified: Secondary | ICD-10-CM | POA: Diagnosis not present

## 2018-08-07 DIAGNOSIS — W57XXXA Bitten or stung by nonvenomous insect and other nonvenomous arthropods, initial encounter: Secondary | ICD-10-CM | POA: Diagnosis not present

## 2018-08-07 DIAGNOSIS — R21 Rash and other nonspecific skin eruption: Secondary | ICD-10-CM | POA: Insufficient documentation

## 2018-08-07 DIAGNOSIS — S40862A Insect bite (nonvenomous) of left upper arm, initial encounter: Secondary | ICD-10-CM | POA: Diagnosis not present

## 2018-08-07 DIAGNOSIS — J45909 Unspecified asthma, uncomplicated: Secondary | ICD-10-CM | POA: Diagnosis not present

## 2018-08-07 DIAGNOSIS — S80862A Insect bite (nonvenomous), left lower leg, initial encounter: Secondary | ICD-10-CM | POA: Diagnosis not present

## 2018-08-07 DIAGNOSIS — S40861A Insect bite (nonvenomous) of right upper arm, initial encounter: Secondary | ICD-10-CM | POA: Diagnosis not present

## 2018-08-07 DIAGNOSIS — S80861A Insect bite (nonvenomous), right lower leg, initial encounter: Secondary | ICD-10-CM | POA: Diagnosis not present

## 2018-08-07 LAB — URINALYSIS, ROUTINE W REFLEX MICROSCOPIC
PH: 7 (ref 5.0–8.0)
SPECIFIC GRAVITY, URINE: 1.025 (ref 1.005–1.030)

## 2018-08-07 LAB — URINALYSIS, MICROSCOPIC (REFLEX)

## 2018-08-07 LAB — PREGNANCY, URINE: Preg Test, Ur: NEGATIVE

## 2018-08-07 MED ORDER — SULFAMETHOXAZOLE-TRIMETHOPRIM 800-160 MG PO TABS
1.0000 | ORAL_TABLET | Freq: Once | ORAL | Status: AC
  Administered 2018-08-07: 1 via ORAL
  Filled 2018-08-07: qty 1

## 2018-08-07 MED ORDER — SULFAMETHOXAZOLE-TRIMETHOPRIM 800-160 MG PO TABS: 1.0000 | ORAL_TABLET | Freq: Two times a day (BID) | ORAL | 0 refills | Status: AC

## 2018-08-07 MED ORDER — PREDNISONE 20 MG PO TABS: 40.0000 mg | ORAL_TABLET | Freq: Every day | ORAL | 0 refills | Status: DC

## 2018-08-07 NOTE — ED Notes (Signed)
Pt noted hematuria since this morning  Denies flank pain   But states that it is painful to urinate  Had a partial hysterectomy 6 months ago

## 2018-08-07 NOTE — Discharge Instructions (Addendum)
It is important that you drink plenty of water and take your antibiotic as directed until its finished.  Take over-the-counter Benadryl every 4 hours if needed for itching and start the prednisone prescription for the bedbug bites.  You will need to wash all your bed linens and clothing in hot water.  Return to the ER for any worsening symptoms such as continued blood in your urine, fever, back pain, or vomiting.

## 2018-08-07 NOTE — ED Provider Notes (Signed)
[facility] EMERGENCY DEPARTMENT Provider Note   CSN: [id number] Arrival date & time: 08/07/18  1616     History   Chief Complaint Chief Complaint  Patient presents with  . Hematuria    HPI Rachel Jefferson is a 38 y.o. female.  HPI   Rachel Jefferson is a 38 y.o. female who presents to the Emergency Department complaining of lower abdominal pressure with urination and hematuria.  Symptoms began on the morning of arrival.  She reports having bright red blood in her urine each time she voids.  She does endorse urinary frequency.  She denies back pain, fever, chills, nausea and vomiting.  No vaginal bleeding or discharge.  She also complains of multiple red welts to both arms and legs.  She states that she notices these when she spends the night at a friend's house.  During that.  She has bedbugs.  She reports itching to the affected areas.  Pain, wheezing, swelling of her face lips or tongue and difficulty breathing   Past Medical History:  Diagnosis Date  . Anxiety   . Asthma   . Bronchitis   . Dyspnea   . GERD (gastroesophageal reflux disease)   . Trichimoniasis     Patient Active Problem List   Diagnosis Date Noted  . S/P abdominal supracervical subtotal hysterectomy 02/09/2018    Past Surgical History:  Procedure Laterality Date  . ABDOMINAL HYSTERECTOMY    . BILATERAL SALPINGECTOMY Bilateral 02/09/2018   Procedure: BILATERAL SALPINGECTOMY;  Surgeon: [name], MD;  Location: [facility];  Service: Gynecology;  Laterality: Bilateral;  . SUPRACERVICAL ABDOMINAL HYSTERECTOMY N/A 02/09/2018   Procedure: HYSTERECTOMY SUPRACERVICAL ABDOMINAL;  Surgeon: [name], MD;  Location: [facility];  Service: Gynecology;  Laterality: N/A;  . TUBAL LIGATION    . tubes tied       OB History    Gravida  5   Para  3   Term      Preterm      AB  2   Living  3     SAB      TAB  2   Ectopic      Multiple      Live Births               Home  Medications    Prior to Admission medications   Medication Sig Start Date End Date Taking? Authorizing Provider  albuterol (PROVENTIL) (2.5 MG/3ML) 0.083% nebulizer solution Take 2.5 mg by nebulization every 6 (six) hours as needed for wheezing or shortness of breath.    [provider]  fluticasone (FLONASE) 50 MCG/ACT nasal spray Place 2 sprays into both nostrils daily.  06/07/18   [provider]  Multiple Vitamins-Minerals (HAIR SKIN AND NAILS FORMULA PO) Take 2 tablets by mouth daily.    [provider]  omeprazole (PRILOSEC) 20 MG capsule Take 1 capsule (20 mg total) by mouth daily. 06/09/18   Isla Pence, MD  oxymetazoline (AFRIN) 0.05 % nasal spray Place 1 spray into both nostrils daily as needed for congestion.    [provider]  predniSONE (STERAPRED UNI-PAK 21 TAB) 5 MG (21) TBPK tablet Take 5-30 mg by mouth See admin instructions. 6 day course starting on 06/07/2018 06/07/18   [provider]  terconazole (TERAZOL 7) 0.4 % vaginal cream Place 1 applicator vaginally at bedtime. Patient not taking: Reported on 06/09/2018 05/17/18   [name], MD  VENTOLIN HFA 108 [phone] Base) MCG/ACT  inhaler Inhale 2 puffs into the lungs every 4 (four) hours as needed for wheezing or shortness of breath.  06/07/18   [provider]  zolpidem (AMBIEN) 10 MG tablet Take 1 tablet (10 mg total) by mouth at bedtime as needed for sleep. Patient taking differently: Take 10 mg by mouth at bedtime.  01/13/18 03/09/18  [name], MD    Family History Family History  Problem Relation Age of Onset  . Heart attack Paternal Grandfather   . Diabetes Paternal Grandmother   . Hypertension Mother     Social History Social History   Tobacco Use  . Smoking status: Never Smoker  . Smokeless tobacco: Never Used  Substance Use Topics  . Alcohol use: No  . Drug use: No     Allergies   Penicillins   Review of Systems Review of Systems    Constitutional: Negative for activity change, appetite change, chills and fever.  HENT: Negative for facial swelling, sore throat and trouble swallowing.   Respiratory: Negative for chest tightness, shortness of breath and wheezing.   Gastrointestinal: Negative for abdominal pain, diarrhea, nausea and vomiting.  Genitourinary: Positive for dysuria, frequency, hematuria and urgency. Negative for decreased urine volume, difficulty urinating, flank pain, vaginal bleeding, vaginal discharge and vaginal pain.  Musculoskeletal: Negative for back pain, neck pain and neck stiffness.  Skin: Positive for rash. Negative for wound.  Neurological: Negative for dizziness, weakness, numbness and headaches.  Hematological: Negative for adenopathy.  Psychiatric/Behavioral: Negative for confusion.     Physical Exam Updated Vital Signs BP (!) 141/96 (BP Location: Right Arm)   Pulse 89   Temp 98.2 F (36.8 C) (Oral)   Resp 18   Ht 5\' 4"  (1.626 m)   Wt 88.5 kg   LMP 01/15/2018   SpO2 100%   BMI 33.47 kg/m   Physical Exam Vitals signs and nursing note reviewed.  Constitutional:      General: She is not in acute distress.    Appearance: Normal appearance. She is well-developed. She is not ill-appearing.  HENT:     Head: Atraumatic.     Mouth/Throat:     Mouth: Mucous membranes are moist.     Pharynx: Oropharynx is clear.  Cardiovascular:     Rate and Rhythm: Normal rate and regular rhythm.  Pulmonary:     Effort: Pulmonary effort is normal. No respiratory distress.     Breath sounds: Normal breath sounds. No wheezing or rales.  Abdominal:     General: There is no distension.     Palpations: Abdomen is soft. Abdomen is not rigid. There is no mass.     Tenderness: There is abdominal tenderness in the suprapubic area. There is no guarding or rebound. Negative signs include McBurney's sign.     Comments: Mild ttp of the suprapubic region.  Remaining abdomen is soft, non-tender without guarding  or rebound tenderness. No CVA tenderness  Musculoskeletal: Normal range of motion.  Skin:    General: Skin is warm.     Capillary Refill: Capillary refill takes less than 2 seconds.     Findings: Rash present.     Comments: Multiple, scattered erythematous papules to the bilateral upper and lower extremities.  No surrounding erythema or edema,  vesicles, or pustules.  Neurological:     General: No focal deficit present.     Mental Status: She is alert.     Sensory: No sensory deficit.     Motor: No weakness.  Psychiatric:  Mood and Affect: Mood normal.      ED Treatments / Results  Labs (all labs ordered are listed, but only abnormal results are displayed) Labs Reviewed  URINALYSIS, ROUTINE W REFLEX MICROSCOPIC - Abnormal; Notable for the following components:      Result Value   Color, Urine RED (*)    APPearance TURBID (*)    Glucose, UA   (*)    Value: TEST NOT REPORTED DUE TO COLOR INTERFERENCE OF URINE PIGMENT   Hgb urine dipstick   (*)    Value: TEST NOT REPORTED DUE TO COLOR INTERFERENCE OF URINE PIGMENT   Bilirubin Urine   (*)    Value: TEST NOT REPORTED DUE TO COLOR INTERFERENCE OF URINE PIGMENT   Ketones, ur   (*)    Value: TEST NOT REPORTED DUE TO COLOR INTERFERENCE OF URINE PIGMENT   Protein, ur   (*)    Value: TEST NOT REPORTED DUE TO COLOR INTERFERENCE OF URINE PIGMENT   Nitrite   (*)    Value: TEST NOT REPORTED DUE TO COLOR INTERFERENCE OF URINE PIGMENT   Leukocytes, UA   (*)    Value: TEST NOT REPORTED DUE TO COLOR INTERFERENCE OF URINE PIGMENT   All other components within normal limits  URINALYSIS, MICROSCOPIC (REFLEX) - Abnormal; Notable for the following components:   Bacteria, UA MANY (*)    All other components within normal limits  URINE CULTURE  PREGNANCY, URINE    EKG None  Radiology No results found.  Procedures Procedures (including critical care time)  Medications Ordered in ED Medications  sulfamethoxazole-trimethoprim  (BACTRIM DS,SEPTRA DS) 800-160 MG per tablet 1 tablet (has no administration in time range)     Initial Impression / Assessment and Plan / ED Course  I have reviewed the triage vital signs and the nursing notes.  Pertinent labs & imaging results that were available during my care of the patient were reviewed by me and considered in my medical decision making (see chart for details).     Patient well-appearing.  Nontoxic-appearing.  Vital signs are stable.  No flank pain fever or vomiting to suggest pyelonephritis.  I feel her symptoms are related to hemorrhagic cystitis.  She also has few scattered erythematous papules to her extremities that may be related to insect bites.  She appears appropriate for discharge home I have discussed treatment plan and she agrees to antibiotics and a short course of prednisone and she will take over-the-counter Benadryl if needed for itching.  Return precautions discussed.  Final Clinical Impressions(s) / ED Diagnoses   Final diagnoses:  Cystitis, acute hemorrhagic  Bedbug bite, initial encounter    ED Discharge Orders         Ordered    sulfamethoxazole-trimethoprim (BACTRIM DS,SEPTRA DS) 800-160 MG tablet  2 times daily     08/07/18 1827    predniSONE (DELTASONE) 20 MG tablet  Daily     08/07/18 1827           Kem Parkinson, PA-C 08/09/18 2212    Nat Christen, MD 08/12/18 2130

## 2018-08-07 NOTE — ED Notes (Signed)
Spec to lab

## 2018-08-07 NOTE — ED Triage Notes (Signed)
Pt reports blood in urine since this am, lower abd pain and pressure with urination. Denies nausea/vomiting.

## 2018-08-07 NOTE — ED Triage Notes (Signed)
Pt also complaining of rash. Whelps with itching. Notices more spots when she wakes up in morning time.

## 2018-08-10 LAB — URINE CULTURE: Culture: >=100000 — AB

## 2018-08-11 ENCOUNTER — Telehealth: Payer: Self-pay

## 2018-08-11 NOTE — Telephone Encounter (Signed)
Post ED Visit - Positive Culture Follow-up  Culture report reviewed by antimicrobial stewardship pharmacist:  []  Elenor Quinones, Pharm.D. []  Heide Guile, Pharm.D., BCPS AQ-ID []  Parks Neptune, Pharm.D., BCPS []  Alycia Rossetti, Pharm.D., BCPS []  [place], Pharm.D., BCPS, AAHIVP []  Legrand Como, Pharm.D., BCPS, AAHIVP []  Salome Arnt, PharmD, BCPS []  Johnnette Gourd, PharmD, BCPS []  Hughes Better, PharmD, BCPS []  Leeroy Cha, PharmD C Charlena Cross Pharm D Positive urine culture Treated with BactrimDS, organism sensitive to the same and no further patient follow-up is required at this time.  Genia Del 08/11/2018, 10:01 AM

## 2018-09-02 ENCOUNTER — Other Ambulatory Visit: Payer: Self-pay | Admitting: *Deleted

## 2018-09-02 ENCOUNTER — Encounter: Payer: Self-pay | Admitting: Internal Medicine

## 2018-09-02 ENCOUNTER — Ambulatory Visit: Payer: BLUE CROSS/BLUE SHIELD | Admitting: Nurse Practitioner

## 2018-09-02 ENCOUNTER — Encounter: Payer: Self-pay | Admitting: Nurse Practitioner

## 2018-09-02 ENCOUNTER — Encounter: Payer: Self-pay | Admitting: *Deleted

## 2018-09-02 DIAGNOSIS — K219 Gastro-esophageal reflux disease without esophagitis: Secondary | ICD-10-CM | POA: Insufficient documentation

## 2018-09-02 DIAGNOSIS — R1310 Dysphagia, unspecified: Secondary | ICD-10-CM | POA: Diagnosis not present

## 2018-09-02 DIAGNOSIS — R1319 Other dysphagia: Secondary | ICD-10-CM

## 2018-09-02 NOTE — Assessment & Plan Note (Signed)
The patient was in the emergency room in November with an episode of solid food dysphagia with steak.  This did eventually clear with emesis.  She was charted on a PPI and her GERD is improved, but not resolved.  Further management of GERD as per above.  She is had 2 episodes of solid food dysphagia since her emergency room visit but these generally have passed with time.  These also both occurred with steak, tends to eat well-done meats.  Denies pill dysphagia.  Otherwise asymptomatic from a GI standpoint.  At this point we will proceed with upper endoscopy with possible dilation.  Will provide printed information related to dysphagia 3 diet.  Follow-up in 3 months after procedure for evaluation.  Proceed with EGD on propofol/MAC with Dr. Gala Romney in near future: the risks, benefits, and alternatives have been discussed with the patient in detail. The patient states understanding and desires to proceed.  The patient is on Ambien.  No other anticoagulants, anxiolytics, chronic pain medications, or antidepressants.  Given her relatively young age and Ambien we will plan for the procedure on propofol/MAC to promote adequate sedation.

## 2018-09-02 NOTE — Patient Instructions (Addendum)
Your health issues we discussed today were:   Heartburn (GERD): 1. Because you are still having heartburn symptoms I will increase your omeprazole (Prilosec). 2. Take this twice a day, in the morning and in the evening, on an empty stomach.  A new prescription has been sent to your pharmacy.  Swallowing problems (dysphagia): 1. We will schedule an upper endoscopy with possible dilation to help with your swallowing difficulties 2. Further recommendations will be made after your upper endoscopy. 3. I am providing printed information below for you 4. Controlling your heartburn better will likely help.  Overall I recommend:  1. Follow-up in 3 months 2. Call us if you have any questions or concerns  At [facility] we value your feedback. You may receive a survey about your visit today. Please share your experience as we strive to create trusting relationships with our patients to provide genuine, compassionate, quality care.  We appreciate your understanding and patience as we review any laboratory studies, imaging, and other diagnostic tests that are ordered as we care for you. Our office policy is 5 business days for review of these results, and any emergent or urgent results are addressed in a timely manner for your best interest. If you do not hear from our office in 1 week, please contact us.   We also encourage the use of MyChart, which contains your medical information for your review as well. If you are not enrolled in this feature, an access code is on this after visit summary for your convenience. Thank you for allowing [place] to be involved in your care.  It was great to see you today!  I hope you have a great day!!      Dysphagia Eating Plan, Bite Size Food This diet plan is for people with moderate swallowing problems who have transitioned from pureed and minced foods. Bite size foods are soft and cut into small chunks so that they can be swallowed safely. On this  eating plan, you may be instructed to drink liquids that are thickened. Work with your health care provider and your diet and nutrition specialist (dietitian) to make sure that you are following the diet safely and getting all the nutrients you need. What are tips for following this plan? General guidelines for foods   You may eat foods that are tender, soft, and moist.  Always test food texture before taking a bite. Poke food with a fork or spoon to make sure it is tender.  Food should be easy to cut and shew. Avoid large pieces of food that require a lot of chewing.  Take small bites. Each bite should be smaller than your thumb nail (about 45mm by 15 mm).  If you were on pureed and minced food diet plans, you may eat any of the foods included in those diets.  Avoid foods that are very dry, hard, sticky, chewy, coarse, or crunchy.  If instructed by your health care provider, thicken liquids. Follow your health care provider's instructions for what products to use, how to do this, and to what thickness. ? Your health care provider may recommend using a commercial thickener, rice cereal, or potato flakes. Ask your health care provider to recommend thickeners. ? Thickened liquids are usually a "pudding-like" consistency, or they may be as thick as honey or thick enough to eat with a spoon. Cooking  To moisten foods, you may add liquids while you are blending, mashing, or grinding your foods to the right consistency. These liquids  include gravies, sauces, vegetable or fruit juice, milk, half and half, or water.  Strain extra liquid from foods before eating.  Reheat foods slowly to prevent a tough crust from forming.  Prepare foods in advance. Meal planning  Eat a variety of foods to get all the nutrients you need.  Some foods may be tolerated better than others. Work with your dietitian to identify which foods are safest for you to eat.  Follow your meal plan as told by your  dietitian. What foods are allowed? Grains Moist breads without nuts or seeds. Biscuits, muffins, pancakes, and waffles that are well-moistened with syrup, jelly, margarine, or butter. Cooked cereals. Moist bread stuffing. Moist rice. Well-moistened cold cereal with small chunks. Well-cooked pasta, noodles, rice, and bread dressing in small pieces and thick sauce. Soft dumplings or spaetzle in small pieces and butter or gravy. Vegetables Soft, well-cooked vegetables in small pieces. Soft-cooked, mashed potatoes. Thickened vegetable juice. Fruits Canned or cooked fruits that are soft or moist and do not have skin or seeds. Fresh, soft bananas. Thickened fruit juices. Meat and other protein foods Tender, moist meats or poultry in small pieces. Moist meatballs or meatloaf. Fish without bones. Eggs or egg substitutes in small pieces. Tofu. Tempeh and meat alternatives in small pieces. Well-cooked, tender beans, peas, baked beans, and other legumes. Dairy Thickened milk. Cream cheese. Yogurt. Cottage cheese. Sour cream. Small pieces of soft cheese. Fats and oils Butter. Oils. Margarine. Mayonnaise. Gravy. Spreads. Sweets and desserts Soft, smooth, moist desserts. Pudding. Custard. Moist cakes. Jam. Jelly. Honey. Preserves. Ask your health care provider whether you can have frozen desserts. Seasoning and other foods All seasonings and sweeteners. All sauces with small chunks. Prepared tuna, egg, or chicken salad without raw fruits or vegetables. Moist casseroles with small, tender pieces of meat. Soups with tender meat. What foods are not allowed? Grains Coarse or dry cereals. Dry breads. Toast. Crackers. Tough, crusty breads, such as [place] bread and baguettes. Dry pancakes, waffles, and muffins. Sticky rice. Dry bread stuffing. Granola. Popcorn. Chips. Vegetables All raw vegetables. Cooked corn. Rubbery or stiff cooked vegetables. Stringy vegetables, such as celery. Tough, crisp fried potatoes.  Potato skins. Fruits Hard, crunchy, stringy, high-pulp, and juicy raw fruits such as apples, pineapple, papaya, and watermelon. Small, round fruits, such as grapes. Dried fruit and fruit leather. Meat and other protein foods Large pieces of meat. Dry, tough meats, such as bacon, sausage, and hot dogs. Chicken, [place], or fish with skin and bones. Crunchy peanut butter. Nuts. Seeds. Nut and seed butters. Dairy Yogurt with nuts, seeds, or large chunks. Large chunks of cheese. Frozen desserts and milk consistency not allowed by your dietitian. Sweets and desserts Dry cakes. Chewy or dry cookies. Any desserts with nuts, seeds, dry fruits, coconut, pineapple, or anything dry, sticky, or hard. Chewy caramel. Licorice. Taffy-type candies. Ask your health care provider whether you can have frozen desserts. Seasoning and other foods Soups with tough or large chunks of meats, poultry, or vegetables. Corn or clam chowder. Smoothies with large chunks of fruit. Summary  Bite size foods can be helpful for people with moderate swallowing problems.  On the dysphagia eating plan, you may eat foods that are soft, moist, and cut into pieces smaller than 18mm by 42mm.  You may be instructed to thicken liquids. Follow your health care provider's instructions about how to do this and to what consistency. This information is not intended to replace advice given to you by your health care provider. Make  sure you discuss any questions you have with your health care provider. Document Released: 07/20/2005 Document Revised: 10/30/2016 Document Reviewed: 10/30/2016 Elsevier Interactive Patient Education  2019 [place] for Gastroesophageal Reflux Disease, Adult When you have gastroesophageal reflux disease (GERD), the foods you eat and your eating habits are very important. Choosing the right foods can help ease the discomfort of GERD. Consider working with a diet and nutrition specialist  (dietitian) to help you make healthy food choices. What general guidelines should I follow?  Eating plan  Choose healthy foods low in fat, such as fruits, vegetables, whole grains, low-fat dairy products, and lean meat, fish, and poultry.  Eat frequent, small meals instead of three large meals each day. Eat your meals slowly, in a relaxed setting. Avoid bending over or lying down until 2-3 hours after eating.  Limit high-fat foods such as fatty meats or fried foods.  Limit your intake of oils, butter, and shortening to less than 8 teaspoons each day.  Avoid the following: ? Foods that cause symptoms. These may be different for different people. Keep a food diary to keep track of foods that cause symptoms. ? Alcohol. ? Drinking large amounts of liquid with meals. ? Eating meals during the 2-3 hours before bed.  Cook foods using methods other than frying. This may include baking, grilling, or broiling. Lifestyle  Maintain a healthy weight. Ask your health care provider what weight is healthy for you. If you need to lose weight, work with your health care provider to do so safely.  Exercise for at least 30 minutes on 5 or more days each week, or as told by your health care provider.  Avoid wearing clothes that fit tightly around your waist and chest.  Do not use any products that contain nicotine or tobacco, such as cigarettes and e-cigarettes. If you need help quitting, ask your health care provider.  Sleep with the head of your bed raised. Use a wedge under the mattress or blocks under the bed frame to raise the head of the bed. What foods are not recommended? The items listed may not be a complete list. Talk with your dietitian about what dietary choices are best for you. Grains Pastries or quick breads with added fat. [place] toast. Vegetables Deep fried vegetables. [place] fries. Any vegetables prepared with added fat. Any vegetables that cause symptoms. For some people this may  include tomatoes and tomato products, chili peppers, onions and garlic, and horseradish. Fruits Any fruits prepared with added fat. Any fruits that cause symptoms. For some people this may include citrus fruits, such as oranges, grapefruit, pineapple, and lemons. Meats and other protein foods High-fat meats, such as fatty beef or pork, hot dogs, ribs, ham, sausage, salami and bacon. Fried meat or protein, including fried fish and fried chicken. Nuts and nut butters. Dairy Whole milk and chocolate milk. Sour cream. Cream. Ice cream. Cream cheese. Milk shakes. Beverages Coffee and tea, with or without caffeine. Carbonated beverages. Sodas. Energy drinks. Fruit juice made with acidic fruits (such as orange or grapefruit). Tomato juice. Alcoholic drinks. Fats and oils Butter. Margarine. Shortening. Ghee. Sweets and desserts Chocolate and cocoa. Donuts. Seasoning and other foods Pepper. Peppermint and spearmint. Any condiments, herbs, or seasonings that cause symptoms. For some people, this may include curry, hot sauce, or vinegar-based salad dressings. Summary  When you have gastroesophageal reflux disease (GERD), food and lifestyle choices are very important to help ease the discomfort of GERD.  Eat frequent, small meals instead of three large meals each day. Eat your meals slowly, in a relaxed setting. Avoid bending over or lying down until 2-3 hours after eating.  Limit high-fat foods such as fatty meat or fried foods. This information is not intended to replace advice given to you by your health care provider. Make sure you discuss any questions you have with your health care provider. Document Released: 07/20/2005 Document Revised: 07/21/2016 Document Reviewed: 07/21/2016 Elsevier Interactive Patient Education  2019 [employer].

## 2018-09-02 NOTE — Progress Notes (Signed)
Primary Care Physician:  Patient, No Pcp Per Primary Gastroenterologist:  Dr. Gala Romney  Chief Complaint  Patient presents with  . Dysphagia    food-mostly steak and weenies    HPI:   Rachel Jefferson is a 38 y.o. female who presents on referral from emergency department for dysphagia.  The patient was seen in the ER 06/10/2019 where she presented with complaints of steak stuck in her throat.  It happened the previous week but she was able to clear it.  Noted and stuck for about 1-1/2 hours and cannot get it to come up or go down.  Has never seen GI before.  Does have a history of GERD.  The patient symptoms did not resolve with glucagon.  She was tentatively planned for EGD with removal of food impaction.  However, shortly after GI was consulted the patient vomited and cleared her esophageal bolus.  She was able to swallow fluids after that.  She is started on Prilosec and encouraged to chew her food and return for any worsening.  No history of colonoscopy or endoscopy in our system.  Today she states she's doing ok overall. Has had 2 more incidents of solid dysphagia with steak. Both times was well done, although she cut into small pieces. Eventually these passed and no further ER visit needs. Has been taking Prilosec which has helped her GERD. Has breakthrough about twice a week. Denies abdominal pain, N/V, hematochezia, melena, fever, chills, unintentional weight loss. Denies chest pain, dyspnea, dizziness, lightheadedness, syncope, near syncope. Denies any other upper or lower GI symptoms.  Past Medical History:  Diagnosis Date  . Anxiety   . Asthma   . Bronchitis   . Dyspnea   . GERD (gastroesophageal reflux disease)   . Insomnia   . Trichimoniasis     Past Surgical History:  Procedure Laterality Date  . ABDOMINAL HYSTERECTOMY    . BILATERAL SALPINGECTOMY Bilateral 02/09/2018   Procedure: BILATERAL SALPINGECTOMY;  Surgeon: [name], MD;  Location: [facility];  Service:  Gynecology;  Laterality: Bilateral;  . SUPRACERVICAL ABDOMINAL HYSTERECTOMY N/A 02/09/2018   Procedure: HYSTERECTOMY SUPRACERVICAL ABDOMINAL;  Surgeon: [name], MD;  Location: [facility];  Service: Gynecology;  Laterality: N/A;  . TUBAL LIGATION    . tubes tied      Current Outpatient Medications  Medication Sig Dispense Refill  . albuterol (PROVENTIL) (2.5 MG/3ML) 0.083% nebulizer solution Take 2.5 mg by nebulization every 6 (six) hours as needed for wheezing or shortness of breath.    . fluticasone (FLONASE) 50 MCG/ACT nasal spray Place 2 sprays into both nostrils daily.   2  . Multiple Vitamins-Minerals (HAIR SKIN AND NAILS FORMULA PO) Take 2 tablets by mouth daily.    Marland Kitchen omeprazole (PRILOSEC) 20 MG capsule Take 1 capsule (20 mg total) by mouth daily. 30 capsule 0  . oxymetazoline (AFRIN) 0.05 % nasal spray Place 1 spray into both nostrils daily as needed for congestion.    . VENTOLIN HFA 108 (90 Base) MCG/ACT inhaler Inhale 2 puffs into the lungs every 4 (four) hours as needed for wheezing or shortness of breath.   0  . zolpidem (AMBIEN) 10 MG tablet Take 1 tablet (10 mg total) by mouth at bedtime as needed for sleep. (Patient taking differently: Take 10 mg by mouth at bedtime as needed. ) 30 tablet 3  . predniSONE (DELTASONE) 20 MG tablet Take 2 tablets (40 mg total) by mouth daily. (Patient not taking: Reported on 09/02/2018) 10  tablet 0  . terconazole (TERAZOL 7) 0.4 % vaginal cream Place 1 applicator vaginally at bedtime. (Patient not taking: Reported on 06/09/2018) 45 g 0   No current facility-administered medications for this visit.     Allergies as of 09/02/2018 - Review Complete 09/02/2018  Allergen Reaction Noted  . Penicillins Hives and Swelling 11/05/2012    Family History  Problem Relation Age of Onset  . Heart attack Paternal Grandfather   . Diabetes Paternal Grandmother   . Hypertension Mother   . Colon cancer Neg Hx   . Gastric cancer Neg Hx   . Esophageal cancer  Neg Hx     Social History   Socioeconomic History  . Marital status: Single    Spouse name: Not on file  . Number of children: Not on file  . Years of education: Not on file  . Highest education level: Not on file  Occupational History  . Not on file  Social Needs  . Financial resource strain: Not on file  . Food insecurity:    Worry: Not on file    Inability: Not on file  . Transportation needs:    Medical: Not on file    Non-medical: Not on file  Tobacco Use  . Smoking status: Never Smoker  . Smokeless tobacco: Never Used  Substance and Sexual Activity  . Alcohol use: No  . Drug use: No  . Sexual activity: Yes    Birth control/protection: Surgical  Lifestyle  . Physical activity:    Days per week: Not on file    Minutes per session: Not on file  . Stress: Not on file  Relationships  . Social connections:    Talks on phone: Not on file    Gets together: Not on file    Attends religious service: Not on file    Active member of club or organization: Not on file    Attends meetings of clubs or organizations: Not on file    Relationship status: Not on file  . Intimate partner violence:    Fear of current or ex partner: Not on file    Emotionally abused: Not on file    Physically abused: Not on file    Forced sexual activity: Not on file  Other Topics Concern  . Not on file  Social History Narrative  . Not on file    Review of Systems: General: Negative for anorexia, weight loss, fever, chills, fatigue, weakness.  ENT: Negative for hoarseness, difficulty swallowing. CV: Negative for chest pain, angina, palpitations, peripheral edema.  Respiratory: Negative for dyspnea at rest, cough, sputum, wheezing.  GI: See history of present illness. MS: Negative for joint pain, low back pain.  Derm: Negative for rash or itching.  Endo: Negative for unusual weight change.  Heme: Negative for bruising or bleeding. Allergy: Negative for rash or hives.    Physical  Exam: BP 109/69   Pulse 66   Temp 97.8 F (36.6 C) (Oral)   Ht 5\' 4"  (1.626 m)   Wt 207 lb (93.9 kg)   LMP 01/15/2018   BMI 35.53 kg/m  General:   Alert and oriented. Pleasant and cooperative. Well-nourished and well-developed.  Head:  Normocephalic and atraumatic. Eyes:  Without icterus, sclera clear and conjunctiva pink.  Ears:  Normal auditory acuity. Cardiovascular:  S1, S2 present without murmurs appreciated. Extremities without clubbing or edema. Respiratory:  Clear to auscultation bilaterally. No wheezes, rales, or rhonchi. No distress.  Gastrointestinal:  +BS, soft, non-tender and non-distended.  No HSM noted. No guarding or rebound. No masses appreciated.  Rectal:  Deferred  Musculoskalatal:  Symmetrical without gross deformities. Skin:  Intact without significant lesions or rashes. Neurologic:  Alert and oriented x4;  grossly normal neurologically. Psych:  Alert and cooperative. Normal mood and affect. Heme/Lymph/Immune: No excessive bruising noted.    09/02/2018 11:10 AM   Disclaimer: This note was dictated with voice recognition software. Similar sounding words can inadvertently be transcribed and may not be corrected upon review.

## 2018-09-02 NOTE — Assessment & Plan Note (Signed)
The patient admits chronic GERD.  She is taking omeprazole daily.  She does still have breakthrough about every 1 to 2 weeks.  Has had recurrent dysphasia since her last episode.  At this point I will increase her omeprazole to twice daily.  We will provide printed education related to GERD.  Return for follow-up in 3 months.

## 2018-09-05 ENCOUNTER — Encounter: Payer: Self-pay | Admitting: Internal Medicine

## 2018-09-05 ENCOUNTER — Telehealth: Payer: Self-pay | Admitting: *Deleted

## 2018-09-05 NOTE — Telephone Encounter (Signed)
Pre-op scheduled for 09/26/2018 at 12:45pm. Letter mailed. Patient aware

## 2018-09-26 ENCOUNTER — Emergency Department (HOSPITAL_COMMUNITY)
Admission: EM | Admit: 2018-09-26 | Discharge: 2018-09-26 | Disposition: A | Discharge status: Home / Self Care | Payer: BLUE CROSS/BLUE SHIELD | Attending: Emergency Medicine | Admitting: Emergency Medicine

## 2018-09-26 ENCOUNTER — Encounter (HOSPITAL_COMMUNITY)
Admission: RE | Admit: 2018-09-26 | Discharge: 2018-09-26 | Disposition: A | Discharge status: Home / Self Care | Payer: BLUE CROSS/BLUE SHIELD | Source: Ambulatory Visit | Attending: Internal Medicine | Admitting: Internal Medicine

## 2018-09-26 ENCOUNTER — Emergency Department (HOSPITAL_COMMUNITY): Payer: BLUE CROSS/BLUE SHIELD

## 2018-09-26 ENCOUNTER — Encounter (HOSPITAL_COMMUNITY): Payer: Self-pay | Admitting: Emergency Medicine

## 2018-09-26 ENCOUNTER — Other Ambulatory Visit: Payer: Self-pay

## 2018-09-26 DIAGNOSIS — Y939 Activity, unspecified: Secondary | ICD-10-CM | POA: Diagnosis not present

## 2018-09-26 DIAGNOSIS — M7052 Other bursitis of knee, left knee: Secondary | ICD-10-CM | POA: Diagnosis not present

## 2018-09-26 DIAGNOSIS — J45909 Unspecified asthma, uncomplicated: Secondary | ICD-10-CM | POA: Diagnosis not present

## 2018-09-26 DIAGNOSIS — Z79899 Other long term (current) drug therapy: Secondary | ICD-10-CM | POA: Diagnosis not present

## 2018-09-26 DIAGNOSIS — F419 Anxiety disorder, unspecified: Secondary | ICD-10-CM | POA: Diagnosis not present

## 2018-09-26 DIAGNOSIS — M25562 Pain in left knee: Secondary | ICD-10-CM | POA: Diagnosis not present

## 2018-09-26 MED ORDER — IBUPROFEN 800 MG PO TABS
800.0000 mg | ORAL_TABLET | Freq: Once | ORAL | Status: AC
  Administered 2018-09-26: 800 mg via ORAL
  Filled 2018-09-26: qty 1

## 2018-09-26 MED ORDER — IBUPROFEN 600 MG PO TABS: 600.0000 mg | ORAL_TABLET | Freq: Four times a day (QID) | ORAL | 0 refills | Status: DC

## 2018-09-26 MED ORDER — TRAMADOL HCL 50 MG PO TABS
100.0000 mg | ORAL_TABLET | Freq: Once | ORAL | Status: AC
  Administered 2018-09-26: 100 mg via ORAL
  Filled 2018-09-26: qty 2

## 2018-09-26 MED ORDER — DEXAMETHASONE 4 MG PO TABS: 4.0000 mg | ORAL_TABLET | Freq: Two times a day (BID) | ORAL | 0 refills | Status: DC

## 2018-09-26 MED ORDER — DEXAMETHASONE SODIUM PHOSPHATE 10 MG/ML IJ SOLN
10.0000 mg | Freq: Once | INTRAMUSCULAR | Status: AC
  Administered 2018-09-26: 10 mg via INTRAMUSCULAR
  Filled 2018-09-26: qty 1

## 2018-09-26 MED ORDER — TRAMADOL HCL 50 MG PO TABS: 50.0000 mg | ORAL_TABLET | Freq: Four times a day (QID) | ORAL | 0 refills | Status: DC | PRN

## 2018-09-26 MED ORDER — ONDANSETRON HCL 4 MG PO TABS
4.0000 mg | ORAL_TABLET | Freq: Once | ORAL | Status: AC
  Administered 2018-09-26: 4 mg via ORAL
  Filled 2018-09-26: qty 1

## 2018-09-26 NOTE — Discharge Instructions (Addendum)
Your examination suggest bursitis involving your knee.  Please use the knee sleeve until symptoms have improved.  Please use ibuprofen with breakfast, lunch, dinner, and at bedtime.  Please use Decadron 2 times daily with food.  May use Ultram for more severe pain. This medication may cause drowsiness. Please do not drink, drive, or participate in activity that requires concentration while taking this medication.  Please use your crutches to prevent additional falls and also to take pressure off of your knee.  Please see Dr. Aline Brochure for orthopedic evaluation and management if not improving.

## 2018-09-26 NOTE — ED Notes (Signed)
Patient transported to X-ray 

## 2018-09-26 NOTE — ED Triage Notes (Signed)
Patient complaining of left knee pain since Saturday. Denies recent injury.

## 2018-09-26 NOTE — ED Provider Notes (Signed)
[facility] EMERGENCY DEPARTMENT Provider Note   CSN: [id number] Arrival date & time: 09/26/18  0845    History   Chief Complaint Chief Complaint  Patient presents with  . Knee Pain    HPI Rachel Jefferson is a 38 y.o. female.     Patient is a 38 year old female who presents to the emergency department with a complaint of left knee pain.  Patient states that this pain is been going on for the last 2 to 3 days.  She denies any recent operations or procedures.  Patient states she does a lot of standing, walking, and going up and down stairs.  She states this is her usual routine and she has not had any new activity or exercise.  No broken skin areas in the lower extremity area.  The knee has not been feeling hot and she has not seen any red streaks going up the leg.  The history is provided by the patient.  Knee Pain  Location:  Knee Injury: no   Knee location:  L knee Associated symptoms: no back pain and no neck pain     Past Medical History:  Diagnosis Date  . Anxiety   . Asthma   . Bronchitis   . Dyspnea   . GERD (gastroesophageal reflux disease)   . Insomnia   . Trichimoniasis     Patient Active Problem List   Diagnosis Date Noted  . GERD (gastroesophageal reflux disease) 09/02/2018  . Dysphagia 09/02/2018  . S/P abdominal supracervical subtotal hysterectomy 02/09/2018    Past Surgical History:  Procedure Laterality Date  . ABDOMINAL HYSTERECTOMY    . BILATERAL SALPINGECTOMY Bilateral 02/09/2018   Procedure: BILATERAL SALPINGECTOMY;  Surgeon: [name], MD;  Location: [facility];  Service: Gynecology;  Laterality: Bilateral;  . SUPRACERVICAL ABDOMINAL HYSTERECTOMY N/A 02/09/2018   Procedure: HYSTERECTOMY SUPRACERVICAL ABDOMINAL;  Surgeon: [name], MD;  Location: [facility];  Service: Gynecology;  Laterality: N/A;  . TUBAL LIGATION    . tubes tied       OB History    Gravida  5   Para  3   Term      Preterm      AB  2   Living  3     SAB      TAB  2   Ectopic      Multiple      Live Births               Home Medications    Prior to Admission medications   Medication Sig Start Date End Date Taking? Authorizing Provider  albuterol (PROVENTIL) (2.5 MG/3ML) 0.083% nebulizer solution Take 2.5 mg by nebulization every 6 (six) hours as needed for wheezing or shortness of breath.    [provider]  fluticasone (FLONASE) 50 MCG/ACT nasal spray Place 2 sprays into both nostrils daily as needed (allergies.).  06/07/18   [provider]  Multiple Vitamins-Minerals (HAIR SKIN AND NAILS FORMULA PO) Take 2 tablets by mouth daily. Gummies    [provider]  omeprazole (PRILOSEC) 20 MG capsule Take 1 capsule (20 mg total) by mouth daily. Patient taking differently: Take 20 mg by mouth daily as needed (heartburn/indigestion.).  06/09/18   Isla Pence, MD  oxymetazoline (AFRIN) 0.05 % nasal spray Place 1 spray into both nostrils daily as needed for congestion.    [provider]  predniSONE (DELTASONE) 20 MG tablet Take 2 tablets (40 mg total) by mouth daily. Patient  not taking: Reported on 09/02/2018 08/07/18   Triplett, Tammy, PA-C  VENTOLIN HFA 108 (90 Base) MCG/ACT inhaler Inhale 2 puffs into the lungs every 4 (four) hours as needed for wheezing or shortness of breath.  06/07/18   [provider]  zolpidem (AMBIEN) 10 MG tablet Take 1 tablet (10 mg total) by mouth at bedtime as needed for sleep. 01/13/18 09/15/19  [name], MD    Family History Family History  Problem Relation Age of Onset  . Heart attack Paternal Grandfather   . Diabetes Paternal Grandmother   . Hypertension Mother   . Colon cancer Neg Hx   . Gastric cancer Neg Hx   . Esophageal cancer Neg Hx     Social History Social History   Tobacco Use  . Smoking status: Never Smoker  . Smokeless tobacco: Never Used  Substance Use Topics  . Alcohol use: Yes    Comment: occasionally  . Drug use: No      Allergies   Penicillins   Review of Systems Review of Systems  Constitutional: Negative for activity change.       All ROS Neg except as noted in HPI  HENT: Negative for nosebleeds.   Eyes: Negative for photophobia and discharge.  Respiratory: Negative for cough, shortness of breath and wheezing.   Cardiovascular: Negative for chest pain and palpitations.  Gastrointestinal: Negative for abdominal pain and blood in stool.  Genitourinary: Negative for dysuria, frequency and hematuria.  Musculoskeletal: Positive for arthralgias. Negative for back pain and neck pain.  Skin: Negative.   Neurological: Negative for dizziness, seizures and speech difficulty.  Psychiatric/Behavioral: Negative for confusion and hallucinations.     Physical Exam Updated Vital Signs Ht 5\' 4"  (1.626 m)   Wt 89.4 kg   LMP 01/15/2018   BMI 33.81 kg/m   Physical Exam Vitals signs and nursing note reviewed.  Constitutional:      Appearance: She is well-developed. She is not toxic-appearing.  HENT:     Head: Normocephalic.     Right Ear: Tympanic membrane and external ear normal.     Left Ear: Tympanic membrane and external ear normal.  Eyes:     General: Lids are normal.     Pupils: Pupils are equal, round, and reactive to light.  Neck:     Musculoskeletal: Normal range of motion and neck supple.     Vascular: No carotid bruit.  Cardiovascular:     Rate and Rhythm: Normal rate and regular rhythm.     Pulses: Normal pulses.     Heart sounds: Normal heart sounds.  Pulmonary:     Effort: No respiratory distress.     Breath sounds: Normal breath sounds.  Abdominal:     General: Bowel sounds are normal.     Palpations: Abdomen is soft.     Tenderness: There is no abdominal tenderness. There is no guarding.  Musculoskeletal: Normal range of motion.     Comments: There is no pain over the left hip area.  No pain in the left thigh.  No deformity at the quadriceps area.  The patella is in the  midline.  There is no palpable effusion noted.  No posterior mass appreciated.  The knee is not hot to touch.  There is tenderness and mild swelling in the anterior tibial tuberosity area.  There is no deformity of the tibial area.  The Achilles tendon is intact.  There is full range of motion of the toes on the left.  Lymphadenopathy:     Head:     Right side of head: No submandibular adenopathy.     Left side of head: No submandibular adenopathy.     Cervical: No cervical adenopathy.  Skin:    General: Skin is warm and dry.  Neurological:     Mental Status: She is alert and oriented to person, place, and time.     Cranial Nerves: No cranial nerve deficit.     Sensory: No sensory deficit.  Psychiatric:        Speech: Speech normal.      ED Treatments / Results  Labs (all labs ordered are listed, but only abnormal results are displayed) Labs Reviewed - No data to display  EKG None  Radiology Dg Knee Complete 4 Views Left  Result Date: 09/26/2018 CLINICAL DATA:  Acute left knee pain without known injury. EXAM: LEFT KNEE - COMPLETE 4+ VIEW COMPARISON:  None. FINDINGS: No evidence of fracture, dislocation, or joint effusion. No evidence of arthropathy or other focal bone abnormality. Soft tissues are unremarkable. IMPRESSION: Negative. Electronically Signed   By: Marijo Conception, M.D.   On: 09/26/2018 09:29    Procedures Procedures (including critical care time)  Medications Ordered in ED Medications - No data to display   Initial Impression / Assessment and Plan / ED Course  I have reviewed the triage vital signs and the nursing notes.  Pertinent labs & imaging results that were available during my care of the patient were reviewed by me and considered in my medical decision making (see chart for details).          Final Clinical Impressions(s) / ED Diagnoses MDM  X-ray of the left knee is negative for fracture or dislocation.  The examination favors a bursitis of  the left knee.  The patient will be treated with steroid medication, anti-inflammatory medication, and pain medication.  Patient was fitted with a knee sleeve.  Patient is to follow-up with Dr. Durenda Guthrie if not improving.  There is no evidence of fracture, dislocation, or knee infection at this time.  Patient is to return to the emergency department if any emergent changes in condition, problems, or concerns.   Final diagnoses:  Bursitis of other bursa of left knee    ED Discharge Orders         Ordered    dexamethasone (DECADRON) 4 MG tablet  2 times daily with meals     09/26/18 1019    ibuprofen (ADVIL,MOTRIN) 600 MG tablet  4 times daily     09/26/18 1019    traMADol (ULTRAM) 50 MG tablet  Every 6 hours PRN     09/26/18 1019           Lily Kocher, PA-C 09/26/18 1025    Fredia Sorrow, MD 09/26/18 1528

## 2018-10-03 ENCOUNTER — Encounter (HOSPITAL_COMMUNITY): Payer: Self-pay | Admitting: *Deleted

## 2018-10-03 ENCOUNTER — Encounter (HOSPITAL_COMMUNITY)
Admission: RE | Disposition: A | Discharge status: Home / Self Care | Payer: Self-pay | Source: Home / Self Care | Attending: Internal Medicine

## 2018-10-03 ENCOUNTER — Ambulatory Visit (HOSPITAL_COMMUNITY)
Admission: RE | Admit: 2018-10-03 | Discharge: 2018-10-03 | Disposition: A | Discharge status: Home / Self Care | Payer: BLUE CROSS/BLUE SHIELD | Attending: Internal Medicine | Admitting: Internal Medicine

## 2018-10-03 ENCOUNTER — Other Ambulatory Visit: Payer: Self-pay

## 2018-10-03 ENCOUNTER — Ambulatory Visit (HOSPITAL_COMMUNITY): Payer: BLUE CROSS/BLUE SHIELD | Admitting: Anesthesiology

## 2018-10-03 DIAGNOSIS — F419 Anxiety disorder, unspecified: Secondary | ICD-10-CM | POA: Diagnosis not present

## 2018-10-03 DIAGNOSIS — K219 Gastro-esophageal reflux disease without esophagitis: Secondary | ICD-10-CM | POA: Diagnosis not present

## 2018-10-03 DIAGNOSIS — Z791 Long term (current) use of non-steroidal anti-inflammatories (NSAID): Secondary | ICD-10-CM | POA: Insufficient documentation

## 2018-10-03 DIAGNOSIS — Z79899 Other long term (current) drug therapy: Secondary | ICD-10-CM | POA: Insufficient documentation

## 2018-10-03 DIAGNOSIS — K449 Diaphragmatic hernia without obstruction or gangrene: Secondary | ICD-10-CM | POA: Diagnosis not present

## 2018-10-03 DIAGNOSIS — Z880 Allergy status to penicillin: Secondary | ICD-10-CM | POA: Diagnosis not present

## 2018-10-03 DIAGNOSIS — E669 Obesity, unspecified: Secondary | ICD-10-CM | POA: Insufficient documentation

## 2018-10-03 DIAGNOSIS — J45909 Unspecified asthma, uncomplicated: Secondary | ICD-10-CM | POA: Diagnosis not present

## 2018-10-03 DIAGNOSIS — K222 Esophageal obstruction: Secondary | ICD-10-CM | POA: Insufficient documentation

## 2018-10-03 DIAGNOSIS — R1310 Dysphagia, unspecified: Secondary | ICD-10-CM | POA: Insufficient documentation

## 2018-10-03 DIAGNOSIS — G4700 Insomnia, unspecified: Secondary | ICD-10-CM | POA: Diagnosis not present

## 2018-10-03 HISTORY — PX: ESOPHAGOGASTRODUODENOSCOPY (EGD) WITH PROPOFOL: SHX5813

## 2018-10-03 HISTORY — PX: MALONEY DILATION: SHX5535

## 2018-10-03 SURGERY — ESOPHAGOGASTRODUODENOSCOPY (EGD) WITH PROPOFOL
Anesthesia: Monitor Anesthesia Care

## 2018-10-03 MED ORDER — MEPERIDINE HCL 100 MG/ML IJ SOLN: 6.2500 mg | INTRAMUSCULAR | Status: DC | PRN

## 2018-10-03 MED ORDER — LACTATED RINGERS IV SOLN
INTRAVENOUS | Status: DC
  Administered 2018-10-03: 1000 mL via INTRAVENOUS

## 2018-10-03 MED ORDER — PROPOFOL 500 MG/50ML IV EMUL
INTRAVENOUS | Status: DC | PRN
  Administered 2018-10-03: 150 ug/kg/min via INTRAVENOUS

## 2018-10-03 MED ORDER — LACTATED RINGERS IV SOLN: INTRAVENOUS | Status: DC

## 2018-10-03 MED ORDER — HYDROCODONE-ACETAMINOPHEN 7.5-325 MG PO TABS: 1.0000 | ORAL_TABLET | Freq: Once | ORAL | Status: DC | PRN

## 2018-10-03 MED ORDER — PROPOFOL 10 MG/ML IV BOLUS
INTRAVENOUS | Status: DC | PRN
  Administered 2018-10-03: 50 mg via INTRAVENOUS

## 2018-10-03 MED ORDER — PROMETHAZINE HCL 25 MG/ML IJ SOLN: 6.2500 mg | INTRAMUSCULAR | Status: DC | PRN

## 2018-10-03 MED ORDER — HYDROMORPHONE HCL 1 MG/ML IJ SOLN: 0.2500 mg | INTRAMUSCULAR | Status: DC | PRN

## 2018-10-03 MED ORDER — STERILE WATER FOR IRRIGATION IR SOLN
Status: DC | PRN
  Administered 2018-10-03: 100 mL

## 2018-10-03 NOTE — Discharge Instructions (Signed)
EGD Discharge instructions Please read the instructions outlined below and refer to this sheet in the next few weeks. These discharge instructions provide you with general information on caring for yourself after you leave the hospital. Your doctor may also give you specific instructions. While your treatment has been planned according to the most current medical practices available, unavoidable complications occasionally occur. If you have any problems or questions after discharge, please call your doctor. ACTIVITY  You may resume your regular activity but move at a slower pace for the next 24 hours.   Take frequent rest periods for the next 24 hours.   Walking will help expel (get rid of) the air and reduce the bloated feeling in your abdomen.   No driving for 24 hours (because of the anesthesia (medicine) used during the test).   You may shower.   Do not sign any important legal documents or operate any machinery for 24 hours (because of the anesthesia used during the test).  NUTRITION  Drink plenty of fluids.   You may resume your normal diet.   Begin with a light meal and progress to your normal diet.   Avoid alcoholic beverages for 24 hours or as instructed by your caregiver.  MEDICATIONS  You may resume your normal medications unless your caregiver tells you otherwise.  WHAT YOU CAN EXPECT TODAY  You may experience abdominal discomfort such as a feeling of fullness or gas pains.  FOLLOW-UP  Your doctor will discuss the results of your test with you.  SEEK IMMEDIATE MEDICAL ATTENTION IF ANY OF THE FOLLOWING OCCUR:  Excessive nausea (feeling sick to your stomach) and/or vomiting.   Severe abdominal pain and distention (swelling).   Trouble swallowing.   Temperature over 101 F (37.8 C).   Rectal bleeding or vomiting of blood.    GERD information provided  Increase omeprazole to 40 mg once daily  Office visit with [place] in 6 months  Chopped meats; eat slowly.  Have liquids on hand to assist with swallowing      Gastroesophageal Reflux Disease, Adult Gastroesophageal reflux (GER) happens when acid from the stomach flows up into the tube that connects the mouth and the stomach (esophagus). Normally, food travels down the esophagus and stays in the stomach to be digested. With GER, food and stomach acid sometimes move back up into the esophagus. You may have a disease called gastroesophageal reflux disease (GERD) if the reflux:  Happens often.  Causes frequent or very bad symptoms.  Causes problems such as damage to the esophagus. When this happens, the esophagus becomes sore and swollen (inflamed). Over time, GERD can make small holes (ulcers) in the lining of the esophagus. What are the causes? This condition is caused by a problem with the muscle between the esophagus and the stomach. When this muscle is weak or not normal, it does not close properly to keep food and acid from coming back up from the stomach. The muscle can be weak because of:  Tobacco use.  Pregnancy.  Having a certain type of hernia (hiatal hernia).  Alcohol use.  Certain foods and drinks, such as coffee, chocolate, onions, and peppermint. What increases the risk? You are more likely to develop this condition if you:  Are overweight.  Have a disease that affects your connective tissue.  Use NSAID medicines. What are the signs or symptoms? Symptoms of this condition include:  Heartburn.  Difficult or painful swallowing.  The feeling of having a lump in the throat.  A bitter taste in the mouth.  Bad breath.  Having a lot of saliva.  Having an upset or bloated stomach.  Belching.  Chest pain. Different conditions can cause chest pain. Make sure you see your doctor if you have chest pain.  Shortness of breath or noisy breathing (wheezing).  Ongoing (chronic) cough or a cough at night.  Wearing away of the surface of teeth (tooth enamel).  Weight  loss. How is this treated? Treatment will depend on how bad your symptoms are. Your doctor may suggest:  Changes to your diet.  Medicine.  Surgery. Follow these instructions at home: Eating and drinking   Follow a diet as told by your doctor. You may need to avoid foods and drinks such as: ? Coffee and tea (with or without caffeine). ? Drinks that contain alcohol. ? Energy drinks and sports drinks. ? Bubbly (carbonated) drinks or sodas. ? Chocolate and cocoa. ? Peppermint and mint flavorings. ? Garlic and onions. ? Horseradish. ? Spicy and acidic foods. These include peppers, chili powder, curry powder, vinegar, hot sauces, and BBQ sauce. ? Citrus fruit juices and citrus fruits, such as oranges, lemons, and limes. ? Tomato-based foods. These include red sauce, chili, salsa, and pizza with red sauce. ? Fried and fatty foods. These include donuts, french fries, potato chips, and high-fat dressings. ? High-fat meats. These include hot dogs, rib eye steak, sausage, ham, and bacon. ? High-fat dairy items, such as whole milk, butter, and cream cheese.  Eat small meals often. Avoid eating large meals.  Avoid drinking large amounts of liquid with your meals.  Avoid eating meals during the 2-3 hours before bedtime.  Avoid lying down right after you eat.  Do not exercise right after you eat. Lifestyle   Do not use any products that contain nicotine or tobacco. These include cigarettes, e-cigarettes, and chewing tobacco. If you need help quitting, ask your doctor.  Try to lower your stress. If you need help doing this, ask your doctor.  If you are overweight, lose an amount of weight that is healthy for you. Ask your doctor about a safe weight loss goal. General instructions  Pay attention to any changes in your symptoms.  Take over-the-counter and prescription medicines only as told by your doctor. Do not take aspirin, ibuprofen, or other NSAIDs unless your doctor says it is  okay.  Wear loose clothes. Do not wear anything tight around your waist.  Raise (elevate) the head of your bed about 6 inches (15 cm).  Avoid bending over if this makes your symptoms worse.  Keep all follow-up visits as told by your doctor. This is important. Contact a doctor if:  You have new symptoms.  You lose weight and you do not know why.  You have trouble swallowing or it hurts to swallow.  You have wheezing or a cough that keeps happening.  Your symptoms do not get better with treatment.  You have a hoarse voice. Get help right away if:  You have pain in your arms, neck, jaw, teeth, or back.  You feel sweaty, dizzy, or light-headed.  You have chest pain or shortness of breath.  You throw up (vomit) and your throw-up looks like blood or coffee grounds.  You pass out (faint).  Your poop (stool) is bloody or black.  You cannot swallow, drink, or eat. Summary  If a person has gastroesophageal reflux disease (GERD), food and stomach acid move back up into the esophagus and cause symptoms or problems  such as damage to the esophagus.  Treatment will depend on how bad your symptoms are.  Follow a diet as told by your doctor.  Take all medicines only as told by your doctor. This information is not intended to replace advice given to you by your health care provider. Make sure you discuss any questions you have with your health care provider. Document Released: 01/06/2008 Document Revised: 01/26/2018 Document Reviewed: 01/26/2018 Elsevier Interactive Patient Education  2019 [place], Care After These instructions provide you with information about caring for yourself after your procedure. Your health care provider may also give you more specific instructions. Your treatment has been planned according to current medical practices, but problems sometimes occur. Call your health care provider if you have any problems or questions  after your procedure. What can I expect after the procedure? After your procedure, you may:  Feel sleepy for several hours.  Feel clumsy and have poor balance for several hours.  Feel forgetful about what happened after the procedure.  Have poor judgment for several hours.  Feel nauseous or vomit.  Have a sore throat if you had a breathing tube during the procedure. Follow these instructions at home: For at least 24 hours after the procedure:      Have a responsible adult stay with you. It is important to have someone help care for you until you are awake and alert.  Rest as needed.  Do not: ? Participate in activities in which you could fall or become injured. ? Drive. ? Use heavy machinery. ? Drink alcohol. ? Take sleeping pills or medicines that cause drowsiness. ? Make important decisions or sign legal documents. ? Take care of children on your own. Eating and drinking  Follow the diet that is recommended by your health care provider.  If you vomit, drink water, juice, or soup when you can drink without vomiting.  Make sure you have little or no nausea before eating solid foods. General instructions  Take over-the-counter and prescription medicines only as told by your health care provider.  If you have sleep apnea, surgery and certain medicines can increase your risk for breathing problems. Follow instructions from your health care provider about wearing your sleep device: ? Anytime you are sleeping, including during daytime naps. ? While taking prescription pain medicines, sleeping medicines, or medicines that make you drowsy.  If you smoke, do not smoke without supervision.  Keep all follow-up visits as told by your health care provider. This is important. Contact a health care provider if:  You keep feeling nauseous or you keep vomiting.  You feel light-headed.  You develop a rash.  You have a fever. Get help right away if:  You have trouble  breathing. Summary  For several hours after your procedure, you may feel sleepy and have poor judgment.  Have a responsible adult stay with you for at least 24 hours or until you are awake and alert. This information is not intended to replace advice given to you by your health care provider. Make sure you discuss any questions you have with your health care provider. Document Released: 11/10/2015 Document Revised: 03/05/2017 Document Reviewed: 11/10/2015 Elsevier Interactive Patient Education  2019 [employer].

## 2018-10-03 NOTE — Anesthesia Preprocedure Evaluation (Signed)
Anesthesia Evaluation    Airway Mallampati: III       Dental  (+) Teeth Intact, Dental Advidsory Given   Pulmonary shortness of breath, asthma ,     + decreased breath sounds      Cardiovascular  Rhythm:regular     Neuro/Psych Anxiety    GI/Hepatic GERD  ,  Endo/Other    Renal/GU      Musculoskeletal   Abdominal   Peds  Hematology   Anesthesia Other Findings Obesity Asthma with dyspnea- unexplained deconditioned  Reproductive/Obstetrics                             Anesthesia Physical Anesthesia Plan  ASA: III  Anesthesia Plan: MAC   Post-op Pain Management:    Induction:   PONV Risk Score and Plan:   Airway Management Planned:   Additional Equipment:   Intra-op Plan:   Post-operative Plan:   Informed Consent:     Dental Advisory Given  Plan Discussed with: Anesthesiologist  Anesthesia Plan Comments:         Anesthesia Quick Evaluation

## 2018-10-03 NOTE — Op Note (Signed)
[facility] Patient Name: Rachel Jefferson Procedure Date: 10/03/2018 2:24 PM MRN: [id number] Date of Birth: May 01, 1981 Attending MD: Norvel Richards , MD CSN: [id number] Age: 38 Admit Type: Outpatient Procedure:                Upper GI endoscopy Indications:              Dysphagia Providers:                Norvel Richards, MD, Rosina Lowenstein, RN, Nelma Rothman, Technician Referring MD:              Medicines:                Propofol per Anesthesia Complications:            No immediate complications. Estimated Blood Loss:     Estimated blood loss was minimal. Procedure:                Pre-Anesthesia Assessment:                           - Prior to the procedure, a History and Physical                            was performed, and patient medications and                            allergies were reviewed. The patient's tolerance of                            previous anesthesia was also reviewed. The risks                            and benefits of the procedure and the sedation                            options and risks were discussed with the patient.                            All questions were answered, and informed consent                            was obtained. Prior Anticoagulants: The patient has                            taken no previous anticoagulant or antiplatelet                            agents. ASA Grade Assessment: II - A patient with                            mild systemic disease. After reviewing the risks  and benefits, the patient was deemed in                            satisfactory condition to undergo the procedure.                           After obtaining informed consent, the endoscope was                            passed under direct vision. Throughout the                            procedure, the patient's blood pressure, pulse, and                            oxygen saturations were  monitored continuously. The                            GIF-H190 ([id number]) scope was introduced through the                            and advanced to the second part of duodenum. The                            upper GI endoscopy was accomplished without                            difficulty. The patient tolerated the procedure                            well. Scope In: 2:45:07 PM Scope Out: 2:50:58 PM Total Procedure Duration: 0 hours 5 minutes 51 seconds  Findings:      A mild Schatzki ring was found at the gastroesophageal junction. No       Barrett's epithelium. No esophagitis. No tumor. The scope was withdrawn.       Dilation was performed with a Maloney dilator with moderate resistance       at 54 Fr. The dilation site was examined following endoscope reinsertion       and showed moderate mucosal disruption. Estimated blood loss was minimal.      A small hiatal hernia was present.      The exam was otherwise without abnormality.      The duodenal bulb and second portion of the duodenum were normal. Impression:               - Mild Schatzki ring. Dilated.                           - Small hiatal hernia.                           - The examination was otherwise normal.                           - Normal duodenal bulb and second portion of the  duodenum.                           - No specimens collected. Moderate Sedation:      Moderate (conscious) sedation was personally administered by an       anesthesia professional. The following parameters were monitored: oxygen       saturation, heart rate, blood pressure, respiratory rate, EKG, adequacy       of pulmonary ventilation, and response to care. Recommendation:           - Patient has a contact number available for                            emergencies. The signs and symptoms of potential                            delayed complications were discussed with the                            patient. Return  to normal activities tomorrow.                            Written discharge instructions were provided to the                            patient.                           - Advance diet as tolerated.                           - Continue present medications.                           - No repeat upper endoscopy.                           - Return to GI office in 6 months. Procedure Code(s):        --- Professional ---                           [phone], Esophagogastroduodenoscopy, flexible,                            transoral; diagnostic, including collection of                            specimen(s) by brushing or washing, when performed                            (separate procedure)                           43450, Dilation of esophagus, by unguided sound or                            bougie, single or multiple passes Diagnosis Code(s):        ---  Professional ---                           K22.2, Esophageal obstruction                           K44.9, Diaphragmatic hernia without obstruction or                            gangrene                           R13.10, Dysphagia, unspecified CPT copyright 2018 American Medical Association. All rights reserved. The codes documented in this report are preliminary and upon coder review may  be revised to meet current compliance requirements. Cristopher Estimable. Ladrea Holladay, MD Norvel Richards, MD 10/03/2018 3:53:18 PM This report has been signed electronically. Number of Addenda: 0

## 2018-10-03 NOTE — Transfer of Care (Signed)
Immediate Anesthesia Transfer of Care Note  Patient: Rachel Jefferson  Procedure(s) Performed: ESOPHAGOGASTRODUODENOSCOPY (EGD) WITH PROPOFOL (N/A ) MALONEY DILATION (N/A )  Patient Location: PACU  Anesthesia Type:MAC  Level of Consciousness: awake, alert  and oriented  Airway & Oxygen Therapy: Patient Spontanous Breathing  Post-op Assessment: Report given to RN  Post vital signs: Reviewed and stable  Last Vitals:  Vitals Value Taken Time  BP 111/67 10/03/2018  2:56 PM  Temp    Pulse 71 10/03/2018  2:59 PM  Resp 18 10/03/2018  2:59 PM  SpO2 98 % 10/03/2018  2:59 PM  Vitals shown include unvalidated device data.  Last Pain:  Vitals:   10/03/18 1453  TempSrc:   PainSc: 0-No pain      Patients Stated Pain Goal: 8 (41/58/30 9407)  Complications: No apparent anesthesia complications

## 2018-10-03 NOTE — Anesthesia Postprocedure Evaluation (Signed)
Anesthesia Post Note  Patient: Rachel Jefferson  Procedure(s) Performed: ESOPHAGOGASTRODUODENOSCOPY (EGD) WITH PROPOFOL (N/A ) MALONEY DILATION (N/A )  Patient location during evaluation: PACU Anesthesia Type: MAC Level of consciousness: awake and alert and oriented Pain management: pain level controlled Vital Signs Assessment: post-procedure vital signs reviewed and stable Respiratory status: spontaneous breathing Cardiovascular status: blood pressure returned to baseline and stable Postop Assessment: no apparent nausea or vomiting Anesthetic complications: no     Last Vitals:  Vitals:   10/03/18 1249 10/03/18 1455  BP: 124/74 111/67  Pulse: 67 (P) 83  Resp: 17 (P) 15  Temp: 36.9 C (P) 36.7 C  SpO2: 97%     Last Pain:  Vitals:   10/03/18 1453  TempSrc:   PainSc: 0-No pain                 Tyriek Hofman

## 2018-10-03 NOTE — H&P (Signed)
@LOGO @   Primary Care Physician:  Patient, No Pcp Per Primary Gastroenterologist:  Dr. Gala Romney  Pre-Procedure History & Physical: HPI:  Rachel Jefferson is a 38 y.o. female here for here for further evaluation of dysphagia via EGD.  GERD well-controlled on omeprazole.  Past Medical History:  Diagnosis Date  . Anxiety   . Asthma   . Bronchitis   . Dyspnea   . GERD (gastroesophageal reflux disease)   . Insomnia   . Trichimoniasis     Past Surgical History:  Procedure Laterality Date  . ABDOMINAL HYSTERECTOMY    . BILATERAL SALPINGECTOMY Bilateral 02/09/2018   Procedure: BILATERAL SALPINGECTOMY;  Surgeon: [name], MD;  Location: [facility];  Service: Gynecology;  Laterality: Bilateral;  . SUPRACERVICAL ABDOMINAL HYSTERECTOMY N/A 02/09/2018   Procedure: HYSTERECTOMY SUPRACERVICAL ABDOMINAL;  Surgeon: [name], MD;  Location: [facility];  Service: Gynecology;  Laterality: N/A;  . TUBAL LIGATION    . tubes tied      Prior to Admission medications   Medication Sig Start Date End Date Taking? Authorizing Provider  albuterol (PROVENTIL) (2.5 MG/3ML) 0.083% nebulizer solution Take 2.5 mg by nebulization every 6 (six) hours as needed for wheezing or shortness of breath.   Yes [provider]  dexamethasone (DECADRON) 4 MG tablet Take 1 tablet (4 mg total) by mouth 2 (two) times daily with a meal. 09/26/18  Yes Lily Kocher, PA-C  fluticasone (FLONASE) 50 MCG/ACT nasal spray Place 2 sprays into both nostrils daily as needed (allergies.).  06/07/18  Yes [provider]  ibuprofen (ADVIL,MOTRIN) 600 MG tablet Take 1 tablet (600 mg total) by mouth 4 (four) times daily. 09/26/18  Yes Lily Kocher, PA-C  Multiple Vitamins-Minerals (HAIR SKIN AND NAILS FORMULA PO) Take 2 tablets by mouth daily. Gummies   Yes [provider]  omeprazole (PRILOSEC) 20 MG capsule Take 1 capsule (20 mg total) by mouth daily. Patient taking differently: Take 20 mg by mouth daily as  needed (heartburn/indigestion.).  06/09/18  Yes Isla Pence, MD  oxymetazoline (AFRIN) 0.05 % nasal spray Place 1 spray into both nostrils daily as needed for congestion.   Yes [provider]  traMADol (ULTRAM) 50 MG tablet Take 1 tablet (50 mg total) by mouth every 6 (six) hours as needed. 09/26/18  Yes Lily Kocher, PA-C  VENTOLIN HFA 108 (90 Base) MCG/ACT inhaler Inhale 2 puffs into the lungs every 4 (four) hours as needed for wheezing or shortness of breath.  06/07/18  Yes [provider]  predniSONE (DELTASONE) 20 MG tablet Take 2 tablets (40 mg total) by mouth daily. Patient not taking: Reported on 09/02/2018 08/07/18   Triplett, Tammy, PA-C  zolpidem (AMBIEN) 10 MG tablet Take 1 tablet (10 mg total) by mouth at bedtime as needed for sleep. 01/13/18 09/15/19  [name], MD    Allergies as of 09/02/2018 - Review Complete 09/02/2018  Allergen Reaction Noted  . Penicillins Hives and Swelling 11/05/2012    Family History  Problem Relation Age of Onset  . Heart attack Paternal Grandfather   . Diabetes Paternal Grandmother   . Hypertension Mother   . Colon cancer Neg Hx   . Gastric cancer Neg Hx   . Esophageal cancer Neg Hx     Social History   Socioeconomic History  . Marital status: Single    Spouse name: Not on file  . Number of children: Not on file  . Years of education: Not on file  . Highest education  level: Not on file  Occupational History  . Not on file  Social Needs  . Financial resource strain: Not on file  . Food insecurity:    Worry: Not on file    Inability: Not on file  . Transportation needs:    Medical: Not on file    Non-medical: Not on file  Tobacco Use  . Smoking status: Never Smoker  . Smokeless tobacco: Never Used  Substance and Sexual Activity  . Alcohol use: Yes    Comment: occasionally  . Drug use: No  . Sexual activity: Yes    Birth control/protection: Surgical  Lifestyle  . Physical activity:    Days per week: Not  on file    Minutes per session: Not on file  . Stress: Not on file  Relationships  . Social connections:    Talks on phone: Not on file    Gets together: Not on file    Attends religious service: Not on file    Active member of club or organization: Not on file    Attends meetings of clubs or organizations: Not on file    Relationship status: Not on file  . Intimate partner violence:    Fear of current or ex partner: Not on file    Emotionally abused: Not on file    Physically abused: Not on file    Forced sexual activity: Not on file  Other Topics Concern  . Not on file  Social History Narrative  . Not on file    Review of Systems: See HPI, otherwise negative ROS  Physical Exam: BP 124/74   Pulse 67   Temp 98.4 F (36.9 C) (Oral)   Resp 17   LMP 01/15/2018   SpO2 97%  General:   Alert,  Well-developed, well-nourished, pleasant and cooperative in NAD Neck:  Supple; no masses or thyromegaly. No significant cervical adenopathy. Lungs:  Clear throughout to auscultation.   No wheezes, crackles, or rhonchi. No acute distress. Heart:  Regular rate and rhythm; no murmurs, clicks, rubs,  or gallops. Abdomen: Non-distended, normal bowel sounds.  Soft and nontender without appreciable mass or hepatosplenomegaly.  Pulses:  Normal pulses noted. Extremities:  Without clubbing or edema.  Impression/Plan: 38 year old lady with esophageal dysphagia.  GERD well-controlled on omeprazole.  EGD now being performed as discussed previously and again today at the bedside.   The risks, benefits, limitations, alternatives and imponderables have been reviewed with the patient. Potential for esophageal dilation, biopsy, etc. have also been reviewed.  Questions have been answered. All parties agreeable.     Notice: This dictation was prepared with Dragon dictation along with smaller phrase technology. Any transcriptional errors that result from this process are unintentional and may not be  corrected upon review.

## 2018-10-06 ENCOUNTER — Encounter (HOSPITAL_COMMUNITY): Payer: Self-pay | Admitting: Internal Medicine

## 2018-10-13 ENCOUNTER — Telehealth: Payer: Self-pay

## 2018-10-13 NOTE — Telephone Encounter (Signed)
Received a PA request from [facility] for Omeprazole 40 mg. Goodrx coupon was left up from for pt. Working on a PA pt hasn't taken any medications in this category.

## 2018-12-01 ENCOUNTER — Encounter: Payer: Self-pay | Admitting: Internal Medicine

## 2018-12-01 ENCOUNTER — Other Ambulatory Visit: Payer: Self-pay

## 2018-12-01 ENCOUNTER — Ambulatory Visit (INDEPENDENT_AMBULATORY_CARE_PROVIDER_SITE_OTHER): Payer: BLUE CROSS/BLUE SHIELD | Admitting: Nurse Practitioner

## 2018-12-01 ENCOUNTER — Encounter: Payer: Self-pay | Admitting: Nurse Practitioner

## 2018-12-01 DIAGNOSIS — R1110 Vomiting, unspecified: Secondary | ICD-10-CM | POA: Insufficient documentation

## 2018-12-01 DIAGNOSIS — K219 Gastro-esophageal reflux disease without esophagitis: Secondary | ICD-10-CM

## 2018-12-01 DIAGNOSIS — R1310 Dysphagia, unspecified: Secondary | ICD-10-CM | POA: Diagnosis not present

## 2018-12-01 DIAGNOSIS — R1111 Vomiting without nausea: Secondary | ICD-10-CM

## 2018-12-01 DIAGNOSIS — R1319 Other dysphagia: Secondary | ICD-10-CM

## 2018-12-01 NOTE — Patient Instructions (Signed)
Your health issues we discussed today were:   GERD (heartburn/reflux) and swallowing difficulties: 1. I am glad your symptoms are doing better 2. Continue taking Prilosec twice a day, as you have been 3. Notify us of any worsening heartburn symptoms or any recurrent swallowing difficulties 4. Follow-up in 2 months  Vomiting without nausea: 1. Given this is only happened twice and you do not have nausea associated with your vomiting, I do not think nausea medications would help 2. Continue to monitor and notify us if you have further recurrences of sudden vomiting 3. Try to identify any triggers that may be causing your vomiting such as specific foods, moving certain ways, doing certain things.  Overall I recommend:  1. Continue your other medications 2. Call us if you have any questions or concerns 3. Return for follow-up in 2 months   Because of recent events of COVID-19 ("Coronavirus"), follow CDC recommendations:  1. Wash your hand frequently 2. Avoid touching your face 3. Stay away from people who are sick 4. If you have symptoms such as fever, cough, shortness of breath then call your healthcare provider for further guidance 5. If you are sick, STAY AT HOME unless otherwise directed by your healthcare provider. 6. Follow directions from state and national officials regarding staying safe  At [facility] we value your feedback. You may receive a survey about your visit today. Please share your experience as we strive to create trusting relationships with our patients to provide genuine, compassionate, quality care.  We appreciate your understanding and patience as we review any laboratory studies, imaging, and other diagnostic tests that are ordered as we care for you. Our office policy is 5 business days for review of these results, and any emergent or urgent results are addressed in a timely manner for your best interest. If you do not hear from our office in 1  week, please contact us.   We also encourage the use of MyChart, which contains your medical information for your review as well. If you are not enrolled in this feature, an access code is on this after visit summary for your convenience. Thank you for allowing [place] to be involved in your care.  It was great to see you today!  I hope you have a great day!!

## 2018-12-01 NOTE — Assessment & Plan Note (Signed)
GERD currently well managed.  Remains on Prilosec twice a day.  She has breakthrough symptoms about once a week but she states this is tolerable for her.  Recommend she continue her current medications, follow-up in 2 months.

## 2018-12-01 NOTE — Progress Notes (Signed)
Referring Provider: No ref. provider found Primary Care Physician:  Patient, No Pcp Per Primary GI:  Dr. Gala Romney  NOTE: Service was provided via telemedicine and was requested by the patient due to COVID-19 pandemic.  Method of visit: [email or web address]  Patient Location: Home  Provider Location: Office  Reason for Phone Visit: Follow-up  The patient was consented to phone follow-up via telephone encounter including billing of the encounter (yes/no): Yes  Persons present on the phone encounter, with roles: None  Total time (minutes) spent on medical discussion: 18 minutes  Chief Complaint  Patient presents with  . Gastroesophageal Reflux    Throwing up    HPI:   Rachel Jefferson is a 38 y.o. female who presents for virtual visit regarding: Follow-up on GERD and dysphagia.  Patient was last seen in our office 09/02/2018 for the same.  History of food impaction which did eventually pass on its own in the emergency department.  Started on PPI by the ER.  At her last visit for ER follow-up she noted 2 more incidents of solid food dysphagia with steak despite cutting into small pieces (although she admits it was well-done cooked).  Prilosec has helped GERD with breakthrough twice a week.  No other GI complaints.  Recommended increase Prilosec to twice a day, EGD with possible dilation, follow-up in 3 months.  EGD was completed 10/03/2018 which found mild Schatzki's ring status post dilation, small hiatal hernia, otherwise normal.  Recommended continue current medications and follow-up in 6 months.  Today she states she's doing pretty good overall. No further dysphagia symptoms. GERD generally well managed, with occasional breakthrough about once a week; states this is tolerable for her. Did vomit once last week and once this week. No significant pre-emesis vomiting. She had just eaten, no dysphagia symptoms. Food before emesis was fish and shrimp (last week) and before the episode this week had  almonds and strawberries. Denies abdominal pain, hematochezia, melena, fever, chills, unintentional weight loss. Denies URI and flu-like symptoms. Denies chest pain, dyspnea, dizziness, lightheadedness, syncope, near syncope. Denies any other upper or lower GI symptoms.  Past Medical History:  Diagnosis Date  . Anxiety   . Asthma   . Bronchitis   . Dyspnea   . GERD (gastroesophageal reflux disease)   . Insomnia   . Trichimoniasis     Past Surgical History:  Procedure Laterality Date  . ABDOMINAL HYSTERECTOMY    . BILATERAL SALPINGECTOMY Bilateral 02/09/2018   Procedure: BILATERAL SALPINGECTOMY;  Surgeon: [name], MD;  Location: [facility];  Service: Gynecology;  Laterality: Bilateral;  . ESOPHAGOGASTRODUODENOSCOPY (EGD) WITH PROPOFOL N/A 10/03/2018   Procedure: ESOPHAGOGASTRODUODENOSCOPY (EGD) WITH PROPOFOL;  Surgeon: Daneil Dolin, MD;  Location: [facility];  Service: Endoscopy;  Laterality: N/A;  1:45pm  . MALONEY DILATION N/A 10/03/2018   Procedure: Venia Minks DILATION;  Surgeon: Daneil Dolin, MD;  Location: [facility];  Service: Endoscopy;  Laterality: N/A;  . SUPRACERVICAL ABDOMINAL HYSTERECTOMY N/A 02/09/2018   Procedure: HYSTERECTOMY SUPRACERVICAL ABDOMINAL;  Surgeon: [name], MD;  Location: [facility];  Service: Gynecology;  Laterality: N/A;  . TUBAL LIGATION    . tubes tied      Current Outpatient Medications  Medication Sig Dispense Refill  . albuterol (PROVENTIL) (2.5 MG/3ML) 0.083% nebulizer solution Take 2.5 mg by nebulization every 6 (six) hours as needed for wheezing or shortness of breath.    . fluticasone (FLONASE) 50 MCG/ACT nasal spray Place 2 sprays into both nostrils  daily as needed (allergies.).   2  . ibuprofen (ADVIL,MOTRIN) 600 MG tablet Take 1 tablet (600 mg total) by mouth 4 (four) times daily. (Patient taking differently: Take 600 mg by mouth as needed. ) 30 tablet 0  . Multiple Vitamins-Minerals (HAIR SKIN AND NAILS FORMULA PO) Take 2 tablets by  mouth daily. Gummies    . omeprazole (PRILOSEC) 20 MG capsule Take 1 capsule (20 mg total) by mouth daily. (Patient taking differently: Take 20 mg by mouth 2 (two) times daily before a meal. ) 30 capsule 0  . oxymetazoline (AFRIN) 0.05 % nasal spray Place 1 spray into both nostrils daily as needed for congestion.    . VENTOLIN HFA 108 (90 Base) MCG/ACT inhaler Inhale 2 puffs into the lungs every 4 (four) hours as needed for wheezing or shortness of breath.   0   No current facility-administered medications for this visit.     Allergies as of 12/01/2018 - Review Complete 12/01/2018  Allergen Reaction Noted  . Penicillins Hives and Swelling 11/05/2012    Family History  Problem Relation Age of Onset  . Heart attack Paternal Grandfather   . Diabetes Paternal Grandmother   . Hypertension Mother   . Colon cancer Neg Hx   . Gastric cancer Neg Hx   . Esophageal cancer Neg Hx     Social History   Socioeconomic History  . Marital status: Single    Spouse name: Not on file  . Number of children: Not on file  . Years of education: Not on file  . Highest education level: Not on file  Occupational History  . Not on file  Social Needs  . Financial resource strain: Not on file  . Food insecurity:    Worry: Not on file    Inability: Not on file  . Transportation needs:    Medical: Not on file    Non-medical: Not on file  Tobacco Use  . Smoking status: Never Smoker  . Smokeless tobacco: Never Used  Substance and Sexual Activity  . Alcohol use: Yes    Comment: occasionally  . Drug use: No  . Sexual activity: Yes    Birth control/protection: Surgical  Lifestyle  . Physical activity:    Days per week: Not on file    Minutes per session: Not on file  . Stress: Not on file  Relationships  . Social connections:    Talks on phone: Not on file    Gets together: Not on file    Attends religious service: Not on file    Active member of club or organization: Not on file    Attends  meetings of clubs or organizations: Not on file    Relationship status: Not on file  Other Topics Concern  . Not on file  Social History Narrative  . Not on file    Review of Systems: General: Negative for anorexia, weight loss, fever, chills, fatigue, weakness. ENT: Negative for hoarseness, difficulty swallowing. CV: Negative for chest pain, angina, palpitations, peripheral edema.  Respiratory: Negative for dyspnea at rest, cough, sputum, wheezing.  GI: See history of present illness. Endo: Negative for unusual weight change.  Heme: Negative for bruising or bleeding. Allergy: Negative for rash or hives.  Physical Exam: Note: limited exam due to virtual visit General:   Alert and oriented. Pleasant and cooperative. Well-nourished and well-developed.  Head:  Normocephalic and atraumatic. Eyes:  Without icterus, sclera clear and conjunctiva pink.  Ears:  Normal auditory acuity. Skin:  Intact without facial significant lesions or rashes. Neurologic:  Alert and oriented x4;  grossly normal neurologically. Psych:  Alert and cooperative. Normal mood and affect. Heme/Lymph/Immune: No excessive bruising noted.

## 2018-12-01 NOTE — Assessment & Plan Note (Signed)
No further dysphasia symptoms since EGD with dilation.  Recommend she continue to monitor for recurrent symptoms and notify us of any.  Continue PPI which will likely help prevent dysphasia in the future.  Follow-up in 2 months.

## 2018-12-01 NOTE — Assessment & Plan Note (Signed)
The patient recently has had 2 episodes of vomiting without a pre-emesis episode and nausea.  Identified foods that she ate just prior to vomiting and did not seem related.  This is only happened twice, not very often.  At this point given no associated nausea I do not think Zofran would help but she likely would not know when to take it to prevent vomiting.  Recommend she continue to monitor.  We can further evaluate if she continues to have issues.  She might of simply had a stomach bug of some sort.  Gastroparesis could be a consideration, although she lacks chronic conditions that would make her high risk for gastroparesis.  No need for GES at this time.  Follow-up in 2 months and further recommendations to follow her clinical progression.

## 2018-12-06 ENCOUNTER — Ambulatory Visit: Payer: BLUE CROSS/BLUE SHIELD | Admitting: Nurse Practitioner

## 2019-01-30 ENCOUNTER — Telehealth: Payer: Self-pay | Admitting: Internal Medicine

## 2019-01-30 ENCOUNTER — Encounter: Payer: Self-pay | Admitting: Internal Medicine

## 2019-01-30 ENCOUNTER — Other Ambulatory Visit: Payer: Self-pay

## 2019-01-30 ENCOUNTER — Other Ambulatory Visit: Payer: Self-pay | Admitting: Internal Medicine

## 2019-01-30 ENCOUNTER — Ambulatory Visit: Payer: BLUE CROSS/BLUE SHIELD | Admitting: Nurse Practitioner

## 2019-01-30 DIAGNOSIS — Z20822 Contact with and (suspected) exposure to covid-19: Secondary | ICD-10-CM

## 2019-01-30 DIAGNOSIS — J45901 Unspecified asthma with (acute) exacerbation: Secondary | ICD-10-CM | POA: Diagnosis not present

## 2019-01-30 DIAGNOSIS — R6889 Other general symptoms and signs: Secondary | ICD-10-CM | POA: Diagnosis not present

## 2019-01-30 NOTE — Telephone Encounter (Signed)
PATIENT WAS A NO SHOW AND LETTER SENT  °

## 2019-01-30 NOTE — Progress Notes (Deleted)
Referring Provider: No ref. provider found Primary Care Physician:  Patient, No Pcp Per Primary GI:  Dr. Gala Romney  No chief complaint on file.   HPI:   Rachel Jefferson is a 38 y.o. female who presents for follow-up on GERD and dysphasia.  The patient was last seen in our office 12/01/2018 for the same as well as non-intractable nausea and vomiting.  Her most recent visit was a virtual visit due to COVID-19/coronavirus pandemic.  At that time noted chronic solid food dysphagia status post food impaction with removal.  EGD most recently on 10/03/2018 found mild Schatzki's ring status post dilation, small hiatal hernia, otherwise normal.  Prilosec twice a day has been helping with symptoms.  At her last visit she noted no further dysphasia, GERD well managed with once a week breakthrough which she notes is tolerable.  She did have an episode of postprandial vomiting the week prior.  No other GI complaints.  Recommended continue Prilosec twice daily, monitor for any further vomiting and notify us, continue other medications, follow-up in 2 months.  Today she states   Past Medical History:  Diagnosis Date  . Anxiety   . Asthma   . Bronchitis   . Dyspnea   . GERD (gastroesophageal reflux disease)   . Insomnia   . Trichimoniasis     Past Surgical History:  Procedure Laterality Date  . ABDOMINAL HYSTERECTOMY    . BILATERAL SALPINGECTOMY Bilateral 02/09/2018   Procedure: BILATERAL SALPINGECTOMY;  Surgeon: [name], MD;  Location: [facility];  Service: Gynecology;  Laterality: Bilateral;  . ESOPHAGOGASTRODUODENOSCOPY (EGD) WITH PROPOFOL N/A 10/03/2018   Procedure: ESOPHAGOGASTRODUODENOSCOPY (EGD) WITH PROPOFOL;  Surgeon: Daneil Dolin, MD;  Location: [facility];  Service: Endoscopy;  Laterality: N/A;  1:45pm  . MALONEY DILATION N/A 10/03/2018   Procedure: Venia Minks DILATION;  Surgeon: Daneil Dolin, MD;  Location: [facility];  Service: Endoscopy;  Laterality: N/A;  . SUPRACERVICAL  ABDOMINAL HYSTERECTOMY N/A 02/09/2018   Procedure: HYSTERECTOMY SUPRACERVICAL ABDOMINAL;  Surgeon: [name], MD;  Location: [facility];  Service: Gynecology;  Laterality: N/A;  . TUBAL LIGATION    . tubes tied      Current Outpatient Medications  Medication Sig Dispense Refill  . albuterol (PROVENTIL) (2.5 MG/3ML) 0.083% nebulizer solution Take 2.5 mg by nebulization every 6 (six) hours as needed for wheezing or shortness of breath.    . fluticasone (FLONASE) 50 MCG/ACT nasal spray Place 2 sprays into both nostrils daily as needed (allergies.).   2  . ibuprofen (ADVIL,MOTRIN) 600 MG tablet Take 1 tablet (600 mg total) by mouth 4 (four) times daily. (Patient taking differently: Take 600 mg by mouth as needed. ) 30 tablet 0  . Multiple Vitamins-Minerals (HAIR SKIN AND NAILS FORMULA PO) Take 2 tablets by mouth daily. Gummies    . omeprazole (PRILOSEC) 20 MG capsule Take 1 capsule (20 mg total) by mouth daily. (Patient taking differently: Take 20 mg by mouth 2 (two) times daily before a meal. ) 30 capsule 0  . oxymetazoline (AFRIN) 0.05 % nasal spray Place 1 spray into both nostrils daily as needed for congestion.    . VENTOLIN HFA 108 (90 Base) MCG/ACT inhaler Inhale 2 puffs into the lungs every 4 (four) hours as needed for wheezing or shortness of breath.   0   No current facility-administered medications for this visit.     Allergies as of 01/30/2019 - Review Complete 12/01/2018  Allergen Reaction Noted  .  Penicillins Hives and Swelling 11/05/2012    Family History  Problem Relation Age of Onset  . Heart attack Paternal Grandfather   . Diabetes Paternal Grandmother   . Hypertension Mother   . Colon cancer Neg Hx   . Gastric cancer Neg Hx   . Esophageal cancer Neg Hx     Social History   Socioeconomic History  . Marital status: Single    Spouse name: Not on file  . Number of children: Not on file  . Years of education: Not on file  . Highest education level: Not on file   Occupational History  . Not on file  Social Needs  . Financial resource strain: Not on file  . Food insecurity    Worry: Not on file    Inability: Not on file  . Transportation needs    Medical: Not on file    Non-medical: Not on file  Tobacco Use  . Smoking status: Never Smoker  . Smokeless tobacco: Never Used  Substance and Sexual Activity  . Alcohol use: Yes    Comment: occasionally  . Drug use: No  . Sexual activity: Yes    Birth control/protection: Surgical  Lifestyle  . Physical activity    Days per week: Not on file    Minutes per session: Not on file  . Stress: Not on file  Relationships  . Social Herbalist on phone: Not on file    Gets together: Not on file    Attends religious service: Not on file    Active member of club or organization: Not on file    Attends meetings of clubs or organizations: Not on file    Relationship status: Not on file  Other Topics Concern  . Not on file  Social History Narrative  . Not on file    Review of Systems: General: Negative for anorexia, weight loss, fever, chills, fatigue, weakness. Eyes: Negative for vision changes.  ENT: Negative for hoarseness, difficulty swallowing , nasal congestion. CV: Negative for chest pain, angina, palpitations, dyspnea on exertion, peripheral edema.  Respiratory: Negative for dyspnea at rest, dyspnea on exertion, cough, sputum, wheezing.  GI: See history of present illness. GU:  Negative for dysuria, hematuria, urinary incontinence, urinary frequency, nocturnal urination.  MS: Negative for joint pain, low back pain.  Derm: Negative for rash or itching.  Neuro: Negative for weakness, abnormal sensation, seizure, frequent headaches, memory loss, confusion.  Psych: Negative for anxiety, depression, suicidal ideation, hallucinations.  Endo: Negative for unusual weight change.  Heme: Negative for bruising or bleeding. Allergy: Negative for rash or hives.   Physical Exam: LMP  01/15/2018  General:   Alert and oriented. Pleasant and cooperative. Well-nourished and well-developed.  Head:  Normocephalic and atraumatic. Eyes:  Without icterus, sclera clear and conjunctiva pink.  Ears:  Normal auditory acuity. Mouth:  No deformity or lesions, oral mucosa pink.  Throat/Neck:  Supple, without mass or thyromegaly. Cardiovascular:  S1, S2 present without murmurs appreciated. Normal pulses noted. Extremities without clubbing or edema. Respiratory:  Clear to auscultation bilaterally. No wheezes, rales, or rhonchi. No distress.  Gastrointestinal:  +BS, soft, non-tender and non-distended. No HSM noted. No guarding or rebound. No masses appreciated.  Rectal:  Deferred  Musculoskalatal:  Symmetrical without gross deformities. Normal posture. Skin:  Intact without significant lesions or rashes. Neurologic:  Alert and oriented x4;  grossly normal neurologically. Psych:  Alert and cooperative. Normal mood and affect. Heme/Lymph/Immune: No significant cervical adenopathy. No excessive  bruising noted.    01/30/2019 7:47 AM   Disclaimer: This note was dictated with voice recognition software. Similar sounding words can inadvertently be transcribed and may not be corrected upon review.

## 2019-02-02 LAB — NOVEL CORONAVIRUS, NAA: SARS-CoV-2, NAA: NOT DETECTED

## 2019-04-21 DIAGNOSIS — E6609 Other obesity due to excess calories: Secondary | ICD-10-CM | POA: Diagnosis not present

## 2019-04-21 DIAGNOSIS — Z6836 Body mass index (BMI) 36.0-36.9, adult: Secondary | ICD-10-CM | POA: Diagnosis not present

## 2019-04-21 DIAGNOSIS — J45909 Unspecified asthma, uncomplicated: Secondary | ICD-10-CM | POA: Diagnosis not present

## 2019-04-21 DIAGNOSIS — J4530 Mild persistent asthma, uncomplicated: Secondary | ICD-10-CM | POA: Diagnosis not present

## 2019-04-21 DIAGNOSIS — Z1389 Encounter for screening for other disorder: Secondary | ICD-10-CM | POA: Diagnosis not present

## 2019-04-26 ENCOUNTER — Other Ambulatory Visit (HOSPITAL_COMMUNITY): Payer: Self-pay | Admitting: Family Medicine

## 2019-04-26 DIAGNOSIS — Z1231 Encounter for screening mammogram for malignant neoplasm of breast: Secondary | ICD-10-CM

## 2019-07-07 DIAGNOSIS — Z20828 Contact with and (suspected) exposure to other viral communicable diseases: Secondary | ICD-10-CM | POA: Diagnosis not present

## 2019-07-07 DIAGNOSIS — E6609 Other obesity due to excess calories: Secondary | ICD-10-CM | POA: Diagnosis not present

## 2019-07-07 DIAGNOSIS — Z6836 Body mass index (BMI) 36.0-36.9, adult: Secondary | ICD-10-CM | POA: Diagnosis not present

## 2019-07-07 DIAGNOSIS — J069 Acute upper respiratory infection, unspecified: Secondary | ICD-10-CM | POA: Diagnosis not present

## 2019-08-23 ENCOUNTER — Other Ambulatory Visit (HOSPITAL_COMMUNITY)
Admission: RE | Admit: 2019-08-23 | Discharge: 2019-08-23 | Disposition: A | Discharge status: Home / Self Care | Payer: BC Managed Care – PPO | Source: Ambulatory Visit | Attending: Obstetrics and Gynecology | Admitting: Obstetrics and Gynecology

## 2019-08-23 ENCOUNTER — Encounter: Payer: Self-pay | Admitting: Obstetrics and Gynecology

## 2019-08-23 ENCOUNTER — Other Ambulatory Visit: Payer: Self-pay

## 2019-08-23 ENCOUNTER — Ambulatory Visit: Payer: BC Managed Care – PPO | Admitting: Obstetrics and Gynecology

## 2019-08-23 VITALS — BP 133/83 | HR 92 | Ht 64.0 in | Wt 211.6 lb

## 2019-08-23 DIAGNOSIS — N898 Other specified noninflammatory disorders of vagina: Secondary | ICD-10-CM | POA: Diagnosis not present

## 2019-08-23 DIAGNOSIS — Z113 Encounter for screening for infections with a predominantly sexual mode of transmission: Secondary | ICD-10-CM

## 2019-08-23 LAB — POCT WET PREP (WET MOUNT)
Clue Cells Wet Prep Whiff POC: NEGATIVE
Trichomonas Wet Prep HPF POC: ABSENT
WBC, Wet Prep HPF POC: NEGATIVE

## 2019-08-23 MED ORDER — METRONIDAZOLE 0.75 % VA GEL: 1.0000 | Freq: Every day | VAGINAL | 1 refills | Status: DC

## 2019-08-23 NOTE — Patient Instructions (Signed)
Mandell, Douglas, and Bennett's Principles and Practice of Infectious Diseases (9th ed., pp. 13558-1371). [place], [place]: Elsevier.">  Cervicitis  Cervicitis is when the cervix gets irritated and swollen. The cervix is the part of the womb (uterus)that opens up to the vagina. What are the causes?  An STI (sexually transmitted infection).  Objects, such as tampons, that are put in the vagina and left for too long.  A reaction to something that is put in the vagina, such as a douche.  An injury to the cervix.  An infection.  Radiation therapy. What increases the risk?  Unprotected sex.  Sex with a lot of partners.  A new sex partner.  Sex at a young age.  A history of STIs. What are the signs or symptoms?  Discharge from the vagina that smells bad or is gray, white, or yellow.  Pain or itchiness around the vagina.  Pain during sex.  Pain in the lower belly area or lower back, especially during sex.  Peeing (urinating) often.  Pain while peeing.  Bleeding from the vagina that is not normal, such as bleeding between menstrual periods, after sex, or after menopause. How is this diagnosed?  A pelvic exam.  A test of fluid from the vagina.  A swab test of the cells of the cervix.  Swabs sent to a lab.  Pee tests. How is this treated?  Medicines such as antibiotics or antivirals. If you are taking these, your sex partner may also need to take them.  Stopping the use of tampons or other things if they cause irritation. Follow these instructions at home: Medicines  Take over-the-counter and prescription medicines only as told by your doctor.  If you were prescribed an antibiotic medicine, take it as told by your doctor. Do not stop taking it even if you start to feel better. General instructions  Do not have sex until your doctor says it is okay.  Keep all follow-up visits as told by your doctor. This is important. Contact a doctor if:  Your symptoms  come back or get worse.  You have a fever.  You feel very tired.  Your belly hurts.  You feel like you are going to vomit or you vomit.  You have watery poop (diarrhea).  Your back hurts. Get help right away if:  You have very bad pain in your belly, and medicine does not help it.  You cannot pee. Summary  This condition happens when the cervix gets irritated and swollen.  It is caused by an infection, a reaction, or an injury to the cervix.  Take all medicines only as told by your doctor.  If you need to take an antibiotic, do not stop taking it even if you start to feel better.  Do not have sex until your doctor says it is okay. This information is not intended to replace advice given to you by your health care provider. Make sure you discuss any questions you have with your health care provider. Document Revised: 01/12/2019 Document Reviewed: 01/12/2019 Elsevier Patient Education  2020 Elsevier Inc.  

## 2019-08-23 NOTE — Progress Notes (Signed)
[facility] Visit  @DATE @            Patient name: Rachel Jefferson MRN [id number]  Date of birth: [birth year]  CC & HPI:  Rachel Jefferson is a 39 y.o. female presenting today for intermittent vaginal bleeding and postcoital bleeding x  Several months. She had no bleeding x 1 yr after her hysterectomy. Pap was normal pre procedure.  ROS:  ROS no weight loss or gain. Uncomplicated hysterectomy 7/10 2019   Pertinent History Reviewed:   Reviewed: Significant for  Medical         Past Medical History:  Diagnosis Date   Anxiety    Asthma    Bronchitis    Dyspnea    GERD (gastroesophageal reflux disease)    Insomnia    Trichimoniasis                               Surgical Hx:    Past Surgical History:  Procedure Laterality Date   ABDOMINAL HYSTERECTOMY     BILATERAL SALPINGECTOMY Bilateral 02/09/2018   Procedure: BILATERAL SALPINGECTOMY;  Surgeon: [name], MD;  Location: [facility];  Service: Gynecology;  Laterality: Bilateral;   ESOPHAGOGASTRODUODENOSCOPY (EGD) WITH PROPOFOL N/A 10/03/2018   Procedure: ESOPHAGOGASTRODUODENOSCOPY (EGD) WITH PROPOFOL;  Surgeon: Daneil Dolin, MD;  [facility]: [facility];  Service: Endoscopy;  Laterality: N/A;  1:45pm   MALONEY DILATION N/A 10/03/2018   Procedure: Venia Minks DILATION;  Surgeon: Daneil Dolin, MD;  [facility];  Service: Endoscopy;  Laterality: N/A;   SUPRACERVICAL ABDOMINAL HYSTERECTOMY N/A 02/09/2018   Procedure: HYSTERECTOMY SUPRACERVICAL ABDOMINAL;  Surgeon: [name], MD;  Location: [facility];  Service: Gynecology;  Laterality: N/A;   TUBAL LIGATION     tubes tied     Medications: Reviewed & Updated - see associated section                       Current Outpatient Medications:    albuterol (PROVENTIL) (2.5 MG/3ML) 0.083% nebulizer solution, Take 2.5 mg by nebulization every 6 (six) hours as needed for wheezing or shortness of breath., Disp: , Rfl:    fluticasone (FLONASE) 50 MCG/ACT nasal  spray, Place 2 sprays into both nostrils daily as needed (allergies.). , Disp: , Rfl: 2   ibuprofen (ADVIL,MOTRIN) 600 MG tablet, Take 1 tablet (600 mg total) by mouth 4 (four) times daily. (Patient taking differently: Take 600 mg by mouth as needed. ), Disp: 30 tablet, Rfl: 0   Multiple Vitamins-Minerals (HAIR SKIN AND NAILS FORMULA PO), Take 2 tablets by mouth daily. Gummies, Disp: , Rfl:    oxymetazoline (AFRIN) 0.05 % nasal spray, Place 1 spray into both nostrils daily as needed for congestion., Disp: , Rfl:    VENTOLIN HFA 108 (90 Base) MCG/ACT inhaler, Inhale 2 puffs into the lungs every 4 (four) hours as needed for wheezing or shortness of breath. , Disp: , Rfl: 0   Social History: Reviewed -  reports that she has never smoked. She has never used smokeless tobacco.  Objective Findings:  Vitals: Blood pressure 133/83, pulse 92, height 5\' 4"  (1.626 m), weight 211 lb 9.6 oz (96 kg), last menstrual period 01/15/2018.  PHYSICAL EXAMINATION General appearance - alert, well appearing, and in no distress, oriented to person, place, and time, overweight, and well hydrated Mental status - alert, oriented to person, place, and time, normal mood,  behavior, speech, dress, motor activity, and thought processes Chest - clear to auscultation, no wheezes, rales or rhonchi, symmetric air entry, no tachypnea, retractions or cyanosis Heart - normal rate and regular rhythm Abdomen - soft, nontender, nondistended, no masses or organomegaly Breasts -  Skin - normal coloration and turgor, no rashes, no suspicious skin lesions noted  PELVIC External genitalia - normal for age Vulva - normal  Vagina - fresh blood from cervix, endocervix specifically .  Cervix - probing the endocervix allows approximately 2 cm of sounding. And provokes fresh bleeding from cervix canal  Uterus -  {surgically absent Adnexa - nontender without masses [place] - no trich , lots of BV and red cells. Few wbc Rectal - rectal exam  not indicated    Assessment & Plan:   A:  Endocervicitis Status post abdominal supracervical hysterectomy  P:  Rx MetroGel per vagina x2 weeks if bleeding persists we will use oral metronidazole.  If persists will require transvaginal ultrasound to measure to cervical residual length, and perform ECC and pap.

## 2019-08-29 LAB — CERVICOVAGINAL ANCILLARY ONLY
Chlamydia: NEGATIVE
Comment: NEGATIVE
Comment: NEGATIVE
Comment: NORMAL
Neisseria Gonorrhea: NEGATIVE
Trichomonas: POSITIVE — AB

## 2019-08-30 ENCOUNTER — Other Ambulatory Visit: Payer: Self-pay | Admitting: *Deleted

## 2019-08-30 MED ORDER — METRONIDAZOLE 500 MG PO TABS: 500.0000 mg | ORAL_TABLET | Freq: Two times a day (BID) | ORAL | 0 refills | Status: DC

## 2019-09-13 ENCOUNTER — Encounter: Payer: Self-pay | Admitting: *Deleted

## 2019-09-13 ENCOUNTER — Other Ambulatory Visit (INDEPENDENT_AMBULATORY_CARE_PROVIDER_SITE_OTHER): Payer: BC Managed Care – PPO | Admitting: *Deleted

## 2019-09-13 ENCOUNTER — Other Ambulatory Visit (HOSPITAL_COMMUNITY)
Admission: RE | Admit: 2019-09-13 | Discharge: 2019-09-13 | Disposition: A | Discharge status: Home / Self Care | Payer: BC Managed Care – PPO | Source: Ambulatory Visit | Attending: Obstetrics and Gynecology | Admitting: Obstetrics and Gynecology

## 2019-09-13 ENCOUNTER — Other Ambulatory Visit: Payer: Self-pay

## 2019-09-13 DIAGNOSIS — Z113 Encounter for screening for infections with a predominantly sexual mode of transmission: Secondary | ICD-10-CM

## 2019-09-13 DIAGNOSIS — Z09 Encounter for follow-up examination after completed treatment for conditions other than malignant neoplasm: Secondary | ICD-10-CM

## 2019-09-13 DIAGNOSIS — A599 Trichomoniasis, unspecified: Secondary | ICD-10-CM

## 2019-09-13 DIAGNOSIS — Z8619 Personal history of other infectious and parasitic diseases: Secondary | ICD-10-CM

## 2019-09-13 NOTE — Progress Notes (Signed)
   NURSE VISIT- VAGINITIS/STD/POC  SUBJECTIVE:  Rachel Jefferson is a 39 y.o. [id number] GYN patientfemale here for a vaginal swab for proof of cure after treatment for Trichomonas.  She reports the following symptoms: none. Denies abnormal vaginal bleeding, significant pelvic pain, fever, or UTI symptoms.  OBJECTIVE:  LMP 01/15/2018   Appears well, in no apparent distress  ASSESSMENT: Vaginal swab for proof of cure after treatment for trich  PLAN: Self-collected vaginal probe for Gonorrhea, Chlamydia, Trichomonas sent to lab Treatment: to be determined once results are received Follow-up as needed if symptoms persist/worsen, or new symptoms develop  Alice Rieger  09/13/2019 3:56 PM

## 2019-09-15 LAB — CERVICOVAGINAL ANCILLARY ONLY
Chlamydia: NEGATIVE
Comment: NEGATIVE
Comment: NEGATIVE
Comment: NORMAL
Neisseria Gonorrhea: NEGATIVE
Trichomonas: POSITIVE — AB

## 2019-09-18 ENCOUNTER — Other Ambulatory Visit: Payer: Self-pay | Admitting: Obstetrics and Gynecology

## 2019-09-18 MED ORDER — METRONIDAZOLE 500 MG PO TABS: 500.0000 mg | ORAL_TABLET | Freq: Two times a day (BID) | ORAL | 1 refills | Status: DC

## 2019-10-16 ENCOUNTER — Other Ambulatory Visit: Payer: Self-pay

## 2019-10-16 ENCOUNTER — Other Ambulatory Visit (HOSPITAL_COMMUNITY)
Admission: RE | Admit: 2019-10-16 | Discharge: 2019-10-16 | Disposition: A | Discharge status: Home / Self Care | Payer: BC Managed Care – PPO | Source: Ambulatory Visit | Attending: Obstetrics & Gynecology | Admitting: Obstetrics & Gynecology

## 2019-10-16 ENCOUNTER — Other Ambulatory Visit (INDEPENDENT_AMBULATORY_CARE_PROVIDER_SITE_OTHER): Payer: BC Managed Care – PPO

## 2019-10-16 DIAGNOSIS — Z113 Encounter for screening for infections with a predominantly sexual mode of transmission: Secondary | ICD-10-CM

## 2019-10-16 NOTE — Addendum Note (Signed)
Addended by: Diona Fanti A on: 10/16/2019 04:13 PM   Modules accepted: Level of Service

## 2019-10-16 NOTE — Progress Notes (Signed)
   NURSE VISIT- VAGINITIS/STD/POC  SUBJECTIVE:  Rachel Jefferson is a 39 y.o. [id number] GYN patientfemale here for a vaginal swab for proof of cure after treated for Trichomonas.  OBJECTIVE:  LMP 01/15/2018   Appears well, in no apparent distress  ASSESSMENT: Vaginal swab for proof of cure. Treated for trichomonas  PLAN: Self-collected vaginal probe for proof of cure. sent to lab Treatment: to be determined once results are received Follow-up as needed if symptoms persist/worsen, or new symptoms develop  Ladonna Snide  10/16/2019 3:47 PM

## 2019-10-18 LAB — CERVICOVAGINAL ANCILLARY ONLY
Comment: NEGATIVE
Trichomonas: NEGATIVE

## 2019-11-09 ENCOUNTER — Ambulatory Visit (INDEPENDENT_AMBULATORY_CARE_PROVIDER_SITE_OTHER): Payer: BC Managed Care – PPO | Admitting: Adult Health

## 2019-11-09 ENCOUNTER — Other Ambulatory Visit: Payer: Self-pay

## 2019-11-09 ENCOUNTER — Encounter: Payer: Self-pay | Admitting: Adult Health

## 2019-11-09 VITALS — BP 123/81 | HR 63 | Ht 64.0 in | Wt 212.0 lb

## 2019-11-09 DIAGNOSIS — Z1211 Encounter for screening for malignant neoplasm of colon: Secondary | ICD-10-CM | POA: Diagnosis not present

## 2019-11-09 DIAGNOSIS — Z1212 Encounter for screening for malignant neoplasm of rectum: Secondary | ICD-10-CM

## 2019-11-09 DIAGNOSIS — Z01419 Encounter for gynecological examination (general) (routine) without abnormal findings: Secondary | ICD-10-CM | POA: Diagnosis not present

## 2019-11-09 LAB — HEMOCCULT GUIAC POC 1CARD (OFFICE): Fecal Occult Blood, POC: NEGATIVE

## 2019-11-09 NOTE — Progress Notes (Signed)
Patient ID: Rachel Jefferson, female   DOB: Feb 17, 1981, 39 y.o.   MRN: [id number] History of Present Illness: Rachel Jefferson is a 39 year old black female,single, [facility], in for a well woman gyn exam. She works at [employer] and is working OT, 21 days straight.  PCP is Delman Cheadle PA.    Current Medications, Allergies, Past Medical History, Past Surgical History, Family History and Social History were reviewed in [employer] record.     Review of Systems: Patient denies any headaches, hearing loss, fatigue, blurred vision, shortness of breath, chest pain, abdominal pain, problems with bowel movements, urination, or intercourse. No joint pain or mood swings.    Physical Exam:BP 123/81 (BP Location: Left Arm, Patient Position: Sitting, Cuff Size: Normal)   Pulse 63   Ht 5\' 4"  (1.626 m)   Wt 212 lb (96.2 kg)   LMP 01/15/2018 Comment: [place]  BMI 36.39 kg/m  General:  Well developed, well nourished, no acute distress Skin:  Warm and dry Neck:  Midline trachea, normal thyroid, good ROM, no lymphadenopathy Lungs; Clear to auscultation bilaterally Breast:  No dominant palpable mass, retraction, or nipple discharge Cardiovascular: Regular rate and rhythm Abdomen:  Soft, non tender, no hepatosplenomegaly Pelvic:  External genitalia is normal in appearance, no lesions.  The vagina is normal in appearance. Urethra has no lesions or masses. The cervix is bulbous.  Uterus is absent. No adnexal masses or tenderness noted.Bladder is non tender, no masses felt. Rectal: Good sphincter tone, no polyps, or hemorrhoids felt.  Hemoccult negative. Extremities/musculoskeletal:  No swelling or varicosities noted, no clubbing or cyanosis Psych:  No mood changes, alert and cooperative,seems happy Fall risk is low AA is 2 PHQ 9 score is 3. Examination chaperoned by Celene Squibb LPN  Impression and Plan: 1. Encounter for well woman exam with routine gynecological exam Pap and physical in  1 year Get mammogram now and yearly Check CBC,CMP,TSH and lipids  2. Screening for colorectal cancer Colonoscopy per GI

## 2019-11-10 LAB — COMPREHENSIVE METABOLIC PANEL
ALT: 18 IU/L (ref 0–32)
AST: 22 IU/L (ref 0–40)
Albumin/Globulin Ratio: 1.4 (ref 1.2–2.2)
Albumin: 3.8 g/dL (ref 3.8–4.8)
Alkaline Phosphatase: 93 IU/L (ref 39–117)
BUN/Creatinine Ratio: 13 (ref 9–23)
BUN: 11 mg/dL (ref 6–24)
Bilirubin Total: 0.3 mg/dL (ref 0.0–1.2)
CO2: 23 mmol/L (ref 20–29)
Calcium: 8.9 mg/dL (ref 8.7–10.2)
Chloride: 103 mmol/L (ref 96–106)
Creatinine, Ser: 0.82 mg/dL (ref 0.57–1.00)
GFR calc Af Amer: 103 mL/min/{1.73_m2} (ref >59)
GFR calc non Af Amer: 89 mL/min/{1.73_m2} (ref >59)
Globulin, Total: 2.8 g/dL (ref 1.5–4.5)
Glucose: 83 mg/dL (ref 65–99)
Potassium: 4.1 mmol/L (ref 3.5–5.2)
Sodium: 137 mmol/L (ref 134–144)
Total Protein: 6.6 g/dL (ref 6.0–8.5)

## 2019-11-10 LAB — LIPID PANEL
Chol/HDL Ratio: 3.5 ratio (ref 0.0–4.4)
Cholesterol, Total: 190 mg/dL (ref 100–199)
HDL: 55 mg/dL (ref >39)
LDL Chol Calc (NIH): 122 mg/dL — ABNORMAL HIGH (ref 0–99)
Triglycerides: 70 mg/dL (ref 0–149)
VLDL Cholesterol Cal: 13 mg/dL (ref 5–40)

## 2019-11-10 LAB — CBC
Hematocrit: 43.8 % (ref 34.0–46.6)
Hemoglobin: 14.1 g/dL (ref 11.1–15.9)
MCH: 26.9 pg (ref 26.6–33.0)
MCHC: 32.2 g/dL (ref 31.5–35.7)
MCV: 84 fL (ref 79–97)
Platelets: 335 10*3/uL (ref 150–450)
RBC: 5.24 x10E6/uL (ref 3.77–5.28)
RDW: 13.2 % (ref 11.7–15.4)
WBC: 8.3 10*3/uL (ref 3.4–10.8)

## 2019-11-10 LAB — TSH: TSH: 1.71 u[IU]/mL (ref 0.450–4.500)

## 2020-01-18 ENCOUNTER — Ambulatory Visit: Admit: 2020-01-18 | Discharge status: Absent without leave | Payer: BC Managed Care – PPO

## 2020-01-18 ENCOUNTER — Ambulatory Visit
Admission: EM | Admit: 2020-01-18 | Discharge: 2020-01-18 | Disposition: A | Discharge status: Home / Self Care | Payer: BC Managed Care – PPO | Attending: Family Medicine | Admitting: Family Medicine

## 2020-01-18 ENCOUNTER — Other Ambulatory Visit: Payer: Self-pay

## 2020-01-18 DIAGNOSIS — H9203 Otalgia, bilateral: Secondary | ICD-10-CM

## 2020-01-18 DIAGNOSIS — H60393 Other infective otitis externa, bilateral: Secondary | ICD-10-CM

## 2020-01-18 MED ORDER — CIPROFLOXACIN-DEXAMETHASONE 0.3-0.1 % OT SUSP: 4.0000 [drp] | Freq: Two times a day (BID) | OTIC | 0 refills | Status: DC

## 2020-01-18 NOTE — ED Triage Notes (Signed)
Pt presents with c/o bilateral ear pain for almost 2 weeks

## 2020-01-18 NOTE — ED Provider Notes (Signed)
[place]   [id number] 01/18/20 Arrival Time: 0947  CC: EAR PAIN  SUBJECTIVE: History from: patient.  Rachel Jefferson is a 39 y.o. female who presents with of bilateral ear pain for the past 2 weeks. Reports that she wears ear plugs at home daily. Patient states the pain is constant and achy in character. Has tried ibuprofen with temporary relief. Symptoms are made worse with lying down. Denies similar symptoms in the past. Denies fever, chills, fatigue, sinus pain, rhinorrhea, ear discharge, sore throat, SOB, wheezing, chest pain, nausea, changes in bowel or bladder habits.    ROS: As per HPI.  All other pertinent ROS negative.     Past Medical History:  Diagnosis Date  . Anxiety   . Asthma   . Bronchitis   . Dyspnea   . GERD (gastroesophageal reflux disease)   . Insomnia   . Trichimoniasis    Past Surgical History:  Procedure Laterality Date  . ABDOMINAL HYSTERECTOMY    . BILATERAL SALPINGECTOMY Bilateral 02/09/2018   Procedure: BILATERAL SALPINGECTOMY;  Surgeon: [name], MD;  Location: [facility];  Service: Gynecology;  Laterality: Bilateral;  . ESOPHAGOGASTRODUODENOSCOPY (EGD) WITH PROPOFOL N/A 10/03/2018   Procedure: ESOPHAGOGASTRODUODENOSCOPY (EGD) WITH PROPOFOL;  Surgeon: Daneil Dolin, MD;  Location: [facility];  Service: Endoscopy;  Laterality: N/A;  1:45pm  . MALONEY DILATION N/A 10/03/2018   Procedure: Venia Minks DILATION;  Surgeon: Daneil Dolin, MD;  Location: [facility];  Service: Endoscopy;  Laterality: N/A;  . SUPRACERVICAL ABDOMINAL HYSTERECTOMY N/A 02/09/2018   Procedure: HYSTERECTOMY SUPRACERVICAL ABDOMINAL;  Surgeon: [name], MD;  Location: [facility];  Service: Gynecology;  Laterality: N/A;  . TUBAL LIGATION    . tubes tied     Allergies  Allergen Reactions  . Penicillins Hives and Swelling    Has patient had a PCN reaction causing immediate rash, facial/tongue/throat swelling, SOB or lightheadedness with hypotension: Yes Has  patient had a PCN reaction causing severe rash involving mucus membranes or skin necrosis: No Has patient had a PCN reaction that required hospitalization: unknown Has patient had a PCN reaction occurring within the last 10 years:No If all of the above answers are "NO", then may proceed with Cephalosporin use.    No current facility-administered medications on file prior to encounter.   Current Outpatient Medications on File Prior to Encounter  Medication Sig Dispense Refill  . albuterol (PROVENTIL) (2.5 MG/3ML) 0.083% nebulizer solution Take 2.5 mg by nebulization every 6 (six) hours as needed for wheezing or shortness of breath.    . fluticasone (FLONASE) 50 MCG/ACT nasal spray Place 2 sprays into both nostrils daily as needed (allergies.).   2  . ibuprofen (ADVIL,MOTRIN) 600 MG tablet Take 1 tablet (600 mg total) by mouth 4 (four) times daily. (Patient taking differently: Take 600 mg by mouth as needed. ) 30 tablet 0  . metroNIDAZOLE (FLAGYL) 500 MG tablet Take 1 tablet (500 mg total) by mouth 2 (two) times daily. 20 tablet 1  . Multiple Vitamins-Minerals (HAIR SKIN AND NAILS FORMULA PO) Take 2 tablets by mouth daily. Gummies    . oxymetazoline (AFRIN) 0.05 % nasal spray Place 1 spray into both nostrils daily as needed for congestion.    . VENTOLIN HFA 108 (90 Base) MCG/ACT inhaler Inhale 2 puffs into the lungs every 4 (four) hours as needed for wheezing or shortness of breath.   0   Social History   Socioeconomic History  . Marital status: Single  Spouse name: Not on file  . Number of children: Not on file  . Years of education: Not on file  . Highest education level: Not on file  Occupational History  . Not on file  Tobacco Use  . Smoking status: Never Smoker  . Smokeless tobacco: Never Used  Vaping Use  . Vaping Use: Never used  Substance and Sexual Activity  . Alcohol use: Yes    Comment: occasionally  . Drug use: No  . Sexual activity: Yes    Birth control/protection:  Surgical    Comment: [facility]  Other Topics Concern  . Not on file  Social History Narrative  . Not on file   Social Determinants of Health   Financial Resource Strain: Low Risk   . Difficulty of Paying Living Expenses: Not very hard  Food Insecurity: No Food Insecurity  . Worried About Charity fundraiser in the Last Year: Never true  . Ran Out of Food in the Last Year: Never true  Transportation Needs: No Transportation Needs  . Lack of Transportation (Medical): No  . Lack of Transportation (Non-Medical): No  Physical Activity: Sufficiently Active  . Days of Exercise per Week: 5 days  . Minutes of Exercise per Session: 60 min  Stress: Stress Concern Present  . Feeling of Stress : To some extent  Social Connections: Moderately Isolated  . Frequency of Communication with Friends and Family: More than three times a week  . Frequency of Social Gatherings with Friends and Family: More than three times a week  . Attends Religious Services: 1 to 4 times per year  . Active Member of Clubs or Organizations: No  . Attends Archivist Meetings: Never  . Marital Status: Never married  Intimate Partner Violence: Not At Risk  . Fear of Current or Ex-Partner: No  . Emotionally Abused: No  . Physically Abused: No  . Sexually Abused: No   Family History  Problem Relation Age of Onset  . Heart attack Paternal Grandfather   . Diabetes Paternal Grandmother   . Hypertension Mother   . Colon cancer Neg Hx   . Gastric cancer Neg Hx   . Esophageal cancer Neg Hx     OBJECTIVE:  Vitals:   01/18/20 1851  BP: 130/85  Pulse: 88  Resp: 20  Temp: 99 F (37.2 C)  SpO2: 98%     General appearance: alert; appears fatigued HEENT: Ears: bilateral EACs erythematous and tender, TMs pearly gray with visible cone of light, without erythema; Eyes: PERRL, EOMI grossly; Sinuses nontender to palpation; Nose: clear rhinorrhea; Throat: oropharynx mildly erythematous, tonsils 1+ without white  tonsillar exudates, uvula midline Neck: supple without LAD Lungs: unlabored respirations, symmetrical air entry; cough: absent; no respiratory distress Heart: regular rate and rhythm.  Radial pulses 2+ symmetrical bilaterally Skin: warm and dry Psychological: alert and cooperative; normal mood and affect  Imaging: No results found.   ASSESSMENT & PLAN:  1. Infective otitis externa of both ears   2. Acute otalgia, bilateral     Meds ordered this encounter  Medications  . ciprofloxacin-dexamethasone (CIPRODEX) OTIC suspension    Sig: Place 4 drops into both ears 2 (two) times daily.    Dispense:  7.5 mL    Refill:  0    Order Specific Question:   Supervising Provider    Answer:   Chase Picket [[id number]]    Bilateral Otalgia Bilateral Otitis Externa Rest and drink plenty of fluids Prescribed ciprofloxacin ear drops Take  medications as directed and to completion Continue to use OTC ibuprofen and/ or tylenol as needed for pain control Follow up with PCP if symptoms persists Return here or go to the ER if you have any new or worsening symptoms   Reviewed expectations re: course of current medical issues. Questions answered. Outlined signs and symptoms indicating need for more acute intervention. Patient verbalized understanding. After Visit Summary given.          Faustino Congress, NP 01/18/20 1945

## 2020-01-18 NOTE — Discharge Instructions (Signed)
Use the ear drops in both ears, 4 drops twice daily for a week  Follow up if you are not feeling better at the end of the course of medication

## 2020-02-25 ENCOUNTER — Ambulatory Visit
Admission: RE | Admit: 2020-02-25 | Discharge: 2020-02-25 | Disposition: A | Discharge status: Home / Self Care | Payer: BC Managed Care – PPO | Source: Ambulatory Visit | Attending: Emergency Medicine | Admitting: Emergency Medicine

## 2020-02-25 ENCOUNTER — Other Ambulatory Visit: Payer: Self-pay

## 2020-02-25 VITALS — BP 107/75 | HR 95 | Temp 98.1°F | Resp 18

## 2020-02-25 DIAGNOSIS — J069 Acute upper respiratory infection, unspecified: Secondary | ICD-10-CM

## 2020-02-25 DIAGNOSIS — R0989 Other specified symptoms and signs involving the circulatory and respiratory systems: Secondary | ICD-10-CM | POA: Diagnosis not present

## 2020-02-25 DIAGNOSIS — Z20822 Contact with and (suspected) exposure to covid-19: Secondary | ICD-10-CM

## 2020-02-25 MED ORDER — CETIRIZINE HCL 10 MG PO TABS: 10.0000 mg | ORAL_TABLET | Freq: Every day | ORAL | 0 refills | Status: AC

## 2020-02-25 MED ORDER — BENZONATATE 100 MG PO CAPS: 100.0000 mg | ORAL_CAPSULE | Freq: Three times a day (TID) | ORAL | 0 refills | Status: DC

## 2020-02-25 MED ORDER — FLUTICASONE PROPIONATE 50 MCG/ACT NA SUSP: 2.0000 | Freq: Every day | NASAL | 0 refills | Status: DC

## 2020-02-25 NOTE — ED Provider Notes (Signed)
[place]   [id number] 02/25/20 Arrival Time: 9371   CC: COVID symptoms  SUBJECTIVE: History from: patient.  Rachel Jefferson is a 39 y.o. female who presents with runny nose, slight cough and chills x 2 days.  Denies sick exposure to COVID, flu or strep.   Has tried OTC medications without relief.  Denies aggravating factors.  Reports previous symptoms in the past with sinus infection.   Denies fever, chills, fatigue, SOB, wheezing, chest pain, nausea, changes in bowel or bladder habits.     ROS: As per HPI.  All other pertinent ROS negative.     Past Medical History:  Diagnosis Date  . Anxiety   . Asthma   . Bronchitis   . Dyspnea   . GERD (gastroesophageal reflux disease)   . Insomnia   . Trichimoniasis    Past Surgical History:  Procedure Laterality Date  . ABDOMINAL HYSTERECTOMY    . BILATERAL SALPINGECTOMY Bilateral 02/09/2018   Procedure: BILATERAL SALPINGECTOMY;  Surgeon: [name], MD;  Location: [facility];  Service: Gynecology;  Laterality: Bilateral;  . ESOPHAGOGASTRODUODENOSCOPY (EGD) WITH PROPOFOL N/A 10/03/2018   Procedure: ESOPHAGOGASTRODUODENOSCOPY (EGD) WITH PROPOFOL;  Surgeon: Daneil Dolin, MD;  [facility];  Service: Endoscopy;  Laterality: N/A;  1:45pm  . MALONEY DILATION N/A 10/03/2018   Procedure: Venia Minks DILATION;  Surgeon: Daneil Dolin, MD;  [facility];  Service: Endoscopy;  Laterality: N/A;  . SUPRACERVICAL ABDOMINAL HYSTERECTOMY N/A 02/09/2018   Procedure: HYSTERECTOMY SUPRACERVICAL ABDOMINAL;  Surgeon: [name], MD;  Location: [facility];  Service: Gynecology;  Laterality: N/A;  . TUBAL LIGATION    . tubes tied     Allergies  Allergen Reactions  . Penicillins Hives and Swelling    Has patient had a PCN reaction causing immediate rash, facial/tongue/throat swelling, SOB or lightheadedness with hypotension: Yes Has patient had a PCN reaction causing severe rash involving mucus membranes or skin  necrosis: No Has patient had a PCN reaction that required hospitalization: unknown Has patient had a PCN reaction occurring within the last 10 years:No If all of the above answers are "NO", then may proceed with Cephalosporin use.    No current facility-administered medications on file prior to encounter.   Current Outpatient Medications on File Prior to Encounter  Medication Sig Dispense Refill  . Multiple Vitamins-Minerals (HAIR SKIN AND NAILS FORMULA PO) Take 2 tablets by mouth daily. Gummies    . albuterol (PROVENTIL) (2.5 MG/3ML) 0.083% nebulizer solution Take 2.5 mg by nebulization every 6 (six) hours as needed for wheezing or shortness of breath.    Marland Kitchen ibuprofen (ADVIL,MOTRIN) 600 MG tablet Take 1 tablet (600 mg total) by mouth 4 (four) times daily. (Patient taking differently: Take 600 mg by mouth as needed. ) 30 tablet 0  . oxymetazoline (AFRIN) 0.05 % nasal spray Place 1 spray into both nostrils daily as needed for congestion.    . VENTOLIN HFA 108 (90 Base) MCG/ACT inhaler Inhale 2 puffs into the lungs every 4 (four) hours as needed for wheezing or shortness of breath.   0   Social History   Socioeconomic History  . Marital status: Single    Spouse name: Not on file  . Number of children: Not on file  . Years of education: Not on file  . Highest education level: Not on file  Occupational History  . Not on file  Tobacco Use  . Smoking status: Never Smoker  . Smokeless tobacco: Never Used  Vaping Use  . Vaping Use: Never used  Substance and Sexual Activity  . Alcohol use: Yes    Comment: occasionally  . Drug use: No  . Sexual activity: Not on file  Other Topics Concern  . Not on file  Social History Narrative  . Not on file   Social Determinants of Health   Financial Resource Strain: Low Risk   . Difficulty of Paying Living Expenses: Not very hard  Food Insecurity: No Food Insecurity  . Worried About Charity fundraiser in the Last Year: Never true  . Ran Out of  Food in the Last Year: Never true  Transportation Needs: No Transportation Needs  . Lack of Transportation (Medical): No  . Lack of Transportation (Non-Medical): No  Physical Activity: Sufficiently Active  . Days of Exercise per Week: 5 days  . Minutes of Exercise per Session: 60 min  Stress: Stress Concern Present  . Feeling of Stress : To some extent  Social Connections: Moderately Isolated  . Frequency of Communication with Friends and Family: More than three times a week  . Frequency of Social Gatherings with Friends and Family: More than three times a week  . Attends Religious Services: 1 to 4 times per year  . Active Member of Clubs or Organizations: No  . Attends Archivist Meetings: Never  . Marital Status: Never married  Intimate Partner Violence: Not At Risk  . Fear of Current or Ex-Partner: No  . Emotionally Abused: No  . Physically Abused: No  . Sexually Abused: No   Family History  Problem Relation Age of Onset  . Heart attack Paternal Grandfather   . Diabetes Paternal Grandmother   . Hypertension Mother   . Colon cancer Neg Hx   . Gastric cancer Neg Hx   . Esophageal cancer Neg Hx     OBJECTIVE:  Vitals:   02/25/20 0856  BP: 107/75  Pulse: 95  Resp: 18  Temp: 98.1 F (36.7 C)  TempSrc: Temporal  SpO2: 96%     General appearance: alert; appears mildly fatigued, but nontoxic; speaking in full sentences and tolerating own secretions HEENT: NCAT; Ears: EACs clear, TMs pearly gray; Eyes: PERRL.  EOM grossly intact. Nose: nares patent without rhinorrhea, Throat: oropharynx clear, tonsils non erythematous or enlarged, uvula midline  Neck: supple without LAD Lungs: unlabored respirations, symmetrical air entry; cough: mild; no respiratory distress; CTAB Heart: regular rate and rhythm.   Skin: warm and dry Psychological: alert and cooperative; normal mood and affect  ASSESSMENT & PLAN:  1. Runny nose   2. Viral URI with cough   3. Suspected  COVID-19 virus infection     Meds ordered this encounter  Medications  . cetirizine (ZYRTEC) 10 MG tablet    Sig: Take 1 tablet (10 mg total) by mouth daily.    Dispense:  30 tablet    Refill:  0    Order Specific Question:   Supervising Provider    Answer:   Raylene Everts [[id number]]  . fluticasone (FLONASE) 50 MCG/ACT nasal spray    Sig: Place 2 sprays into both nostrils daily.    Dispense:  16 g    Refill:  0    Order Specific Question:   Supervising Provider    Answer:   Raylene Everts [[id number]]  . benzonatate (TESSALON) 100 MG capsule    Sig: Take 1 capsule (100 mg total) by mouth every 8 (eight) hours.    Dispense:  21 capsule  Refill:  0    Order Specific Question:   Supervising Provider    Answer:   Raylene Everts [[id number]]   COVID testing ordered.  It will take between 2-5 days for test results.  Someone will contact you regarding abnormal results.    In the meantime: You should remain isolated in your home for 10 days from symptom onset AND greater than 72 hours after symptoms resolution (absence of fever without the use of fever-reducing medication and improvement in respiratory symptoms), whichever is longer Get plenty of rest and push fluids Tessalon Perles prescribed for cough zyrtec for nasal congestion, runny nose, and/or sore throat flonase for nasal congestion and runny nose Use medications daily for symptom relief Use OTC medications like ibuprofen or tylenol as needed fever or pain Call or go to the ED if you have any new or worsening symptoms such as fever, worsening cough, shortness of breath, chest tightness, chest pain, turning blue, changes in mental status, etc...   Reviewed expectations re: course of current medical issues. Questions answered. Outlined signs and symptoms indicating need for more acute intervention. Patient verbalized understanding. After Visit Summary given.         Lestine Box, PA-C 02/25/20 [phone]

## 2020-02-25 NOTE — Discharge Instructions (Signed)
COVID testing ordered.  It will take between 2-5 days for test results.  Someone will contact you regarding abnormal results.    In the meantime: You should remain isolated in your home for 10 days from symptom onset AND greater than 72 hours after symptoms resolution (absence of fever without the use of fever-reducing medication and improvement in respiratory symptoms), whichever is longer Get plenty of rest and push fluids Tessalon Perles prescribed for cough zyrtec for nasal congestion, runny nose, and/or sore throat flonase for nasal congestion and runny nose Use medications daily for symptom relief Use OTC medications like ibuprofen or tylenol as needed fever or pain Call or go to the ED if you have any new or worsening symptoms such as fever, worsening cough, shortness of breath, chest tightness, chest pain, turning blue, changes in mental status, etc...  

## 2020-02-25 NOTE — ED Triage Notes (Signed)
C/O runny nose, slight cough, sore throat, chills x 2 days.  Unk if fevers.

## 2020-02-26 LAB — NOVEL CORONAVIRUS, NAA: SARS-CoV-2, NAA: NOT DETECTED

## 2020-02-26 LAB — SARS-COV-2, NAA 2 DAY TAT

## 2020-02-27 ENCOUNTER — Other Ambulatory Visit (INDEPENDENT_AMBULATORY_CARE_PROVIDER_SITE_OTHER): Payer: BC Managed Care – PPO | Admitting: *Deleted

## 2020-02-27 ENCOUNTER — Other Ambulatory Visit (HOSPITAL_COMMUNITY)
Admission: RE | Admit: 2020-02-27 | Discharge: 2020-02-27 | Disposition: A | Discharge status: Home / Self Care | Payer: BC Managed Care – PPO | Source: Ambulatory Visit | Attending: Obstetrics and Gynecology | Admitting: Obstetrics and Gynecology

## 2020-02-27 DIAGNOSIS — Z113 Encounter for screening for infections with a predominantly sexual mode of transmission: Secondary | ICD-10-CM

## 2020-02-27 NOTE — Progress Notes (Signed)
   NURSE VISIT- STD  SUBJECTIVE:  Rachel Jefferson is a 39 y.o. [id number] GYN patientfemale here for a vaginal swab for STD screen.  She reports the following symptoms: none for 0 days. Denies abnormal vaginal bleeding, significant pelvic pain, fever, or UTI symptoms.  OBJECTIVE:  LMP 01/15/2018 Comment: [place]  Appears well, in no apparent distress  ASSESSMENT: Vaginal swab for STD screen  PLAN: Self-collected vaginal probe for Gonorrhea, Chlamydia, Trichomonas sent to lab Treatment: to be determined once results are received Follow-up as needed if symptoms persist/worsen, or new symptoms develop  Levy Pupa  02/27/2020 11:02 AM

## 2020-02-27 NOTE — Progress Notes (Signed)
Chart reviewed for nurse visit. Agree with plan of care.  Estill Dooms, NP 02/27/2020 12:56 PM

## 2020-02-28 ENCOUNTER — Ambulatory Visit
Admission: EM | Admit: 2020-02-28 | Discharge: 2020-02-28 | Disposition: A | Discharge status: Home / Self Care | Payer: BC Managed Care – PPO

## 2020-02-28 LAB — CERVICOVAGINAL ANCILLARY ONLY
Chlamydia: NEGATIVE
Comment: NEGATIVE
Comment: NEGATIVE
Comment: NORMAL
Neisseria Gonorrhea: NEGATIVE
Trichomonas: NEGATIVE

## 2020-03-20 ENCOUNTER — Other Ambulatory Visit: Payer: Self-pay

## 2020-03-20 ENCOUNTER — Ambulatory Visit
Admission: EM | Admit: 2020-03-20 | Discharge: 2020-03-20 | Disposition: A | Discharge status: Home / Self Care | Payer: BC Managed Care – PPO | Attending: Emergency Medicine | Admitting: Emergency Medicine

## 2020-03-20 DIAGNOSIS — Z1152 Encounter for screening for COVID-19: Secondary | ICD-10-CM | POA: Diagnosis not present

## 2020-03-20 NOTE — ED Triage Notes (Signed)
Pt here for covid test , no symptoms

## 2020-03-21 LAB — SARS-COV-2, NAA 2 DAY TAT

## 2020-03-21 LAB — NOVEL CORONAVIRUS, NAA: SARS-CoV-2, NAA: NOT DETECTED

## 2020-04-10 ENCOUNTER — Other Ambulatory Visit (INDEPENDENT_AMBULATORY_CARE_PROVIDER_SITE_OTHER): Payer: BC Managed Care – PPO

## 2020-04-10 DIAGNOSIS — R8290 Unspecified abnormal findings in urine: Secondary | ICD-10-CM | POA: Diagnosis not present

## 2020-04-10 LAB — POCT URINALYSIS DIPSTICK
Glucose, UA: NEGATIVE
Ketones, UA: NEGATIVE
Leukocytes, UA: NEGATIVE
Nitrite, UA: NEGATIVE
Protein, UA: NEGATIVE

## 2020-04-10 NOTE — Progress Notes (Signed)
° °  NURSE VISIT- UTI SYMPTOMS   SUBJECTIVE:  Rachel Jefferson is a 39 y.o. [id number] female here for UTI symptoms. She is a GYN patient. She reports urine has a foul odor.  No other symptoms..  OBJECTIVE:  LMP 01/15/2018 Comment: [place] well, in no apparent distress  Results for orders placed or performed in visit on 04/10/20 (from the past 24 hour(s))  POCT Urinalysis Dipstick   Collection Time: 04/10/20  4:22 PM  Result Value Ref Range   Color, UA     Clarity, UA     Glucose, UA Negative Negative   Bilirubin, UA     Ketones, UA neg    Spec Grav, UA     Blood, UA small    pH, UA     Protein, UA Negative Negative   Urobilinogen, UA     Nitrite, UA neg    Leukocytes, UA Negative Negative   Appearance     Odor      ASSESSMENT: GYN patient with UTI symptoms and negative nitrites  PLAN: Discussed with Rachel Jefferson, AGNP   Rx sent by provider today: No Urine culture sent Call or return to clinic prn if these symptoms worsen or fail to improve as anticipated. Follow-up: as needed   Rachel Jefferson  04/10/2020 4:42 PM

## 2020-04-10 NOTE — Progress Notes (Signed)
Chart reviewed for nurse visit. Agree with plan of care.  Estill Dooms, NP 04/10/2020 5:17 PM

## 2020-04-11 ENCOUNTER — Other Ambulatory Visit (HOSPITAL_COMMUNITY): Payer: Self-pay | Admitting: Adult Health

## 2020-04-11 DIAGNOSIS — Z1231 Encounter for screening mammogram for malignant neoplasm of breast: Secondary | ICD-10-CM

## 2020-04-12 LAB — URINE CULTURE

## 2020-04-15 ENCOUNTER — Telehealth: Payer: Self-pay | Admitting: Adult Health

## 2020-04-15 ENCOUNTER — Other Ambulatory Visit: Payer: Self-pay

## 2020-04-15 ENCOUNTER — Ambulatory Visit (HOSPITAL_COMMUNITY)
Admission: RE | Admit: 2020-04-15 | Discharge: 2020-04-15 | Disposition: A | Discharge status: Home / Self Care | Payer: BC Managed Care – PPO | Source: Ambulatory Visit

## 2020-04-15 DIAGNOSIS — Z1231 Encounter for screening mammogram for malignant neoplasm of breast: Secondary | ICD-10-CM

## 2020-04-15 MED ORDER — SULFAMETHOXAZOLE-TRIMETHOPRIM 800-160 MG PO TABS
1.0000 | ORAL_TABLET | Freq: Two times a day (BID) | ORAL | 0 refills | Status: DC
Start: 2020-04-15 — End: 2020-10-31

## 2020-04-15 NOTE — Telephone Encounter (Signed)
Pt aware that urine culture +E coli, will rx septra ds 1 bid x 7 days

## 2020-06-10 ENCOUNTER — Other Ambulatory Visit: Payer: Self-pay

## 2020-06-10 ENCOUNTER — Ambulatory Visit (HOSPITAL_COMMUNITY)
Admission: RE | Admit: 2020-06-10 | Discharge: 2020-06-10 | Disposition: A | Discharge status: Home / Self Care | Payer: BC Managed Care – PPO | Source: Ambulatory Visit | Attending: Adult Health | Admitting: Adult Health

## 2020-06-10 DIAGNOSIS — Z1231 Encounter for screening mammogram for malignant neoplasm of breast: Secondary | ICD-10-CM | POA: Insufficient documentation

## 2020-06-13 ENCOUNTER — Other Ambulatory Visit (HOSPITAL_COMMUNITY): Payer: Self-pay | Admitting: Adult Health

## 2020-06-13 DIAGNOSIS — R928 Other abnormal and inconclusive findings on diagnostic imaging of breast: Secondary | ICD-10-CM

## 2020-07-05 ENCOUNTER — Ambulatory Visit (HOSPITAL_COMMUNITY)
Admission: RE | Admit: 2020-07-05 | Discharge: 2020-07-05 | Disposition: A | Discharge status: Home / Self Care | Payer: BC Managed Care – PPO | Source: Ambulatory Visit | Attending: Adult Health | Admitting: Adult Health

## 2020-07-05 ENCOUNTER — Other Ambulatory Visit: Payer: Self-pay

## 2020-07-05 DIAGNOSIS — R928 Other abnormal and inconclusive findings on diagnostic imaging of breast: Secondary | ICD-10-CM

## 2020-07-05 DIAGNOSIS — N6011 Diffuse cystic mastopathy of right breast: Secondary | ICD-10-CM | POA: Diagnosis not present

## 2020-07-05 DIAGNOSIS — N6012 Diffuse cystic mastopathy of left breast: Secondary | ICD-10-CM | POA: Diagnosis not present

## 2020-10-31 ENCOUNTER — Ambulatory Visit (INDEPENDENT_AMBULATORY_CARE_PROVIDER_SITE_OTHER): Payer: BC Managed Care – PPO | Admitting: Adult Health

## 2020-10-31 ENCOUNTER — Other Ambulatory Visit (HOSPITAL_COMMUNITY)
Admission: RE | Admit: 2020-10-31 | Discharge: 2020-10-31 | Disposition: A | Discharge status: Home / Self Care | Payer: BC Managed Care – PPO | Source: Ambulatory Visit | Attending: Adult Health | Admitting: Adult Health

## 2020-10-31 ENCOUNTER — Other Ambulatory Visit: Payer: Self-pay

## 2020-10-31 ENCOUNTER — Encounter: Payer: Self-pay | Admitting: Adult Health

## 2020-10-31 VITALS — BP 115/74 | HR 78 | Ht 64.0 in | Wt 198.5 lb

## 2020-10-31 DIAGNOSIS — N898 Other specified noninflammatory disorders of vagina: Secondary | ICD-10-CM | POA: Diagnosis not present

## 2020-10-31 DIAGNOSIS — N939 Abnormal uterine and vaginal bleeding, unspecified: Secondary | ICD-10-CM | POA: Insufficient documentation

## 2020-10-31 DIAGNOSIS — Z90711 Acquired absence of uterus with remaining cervical stump: Secondary | ICD-10-CM

## 2020-10-31 DIAGNOSIS — Z113 Encounter for screening for infections with a predominantly sexual mode of transmission: Secondary | ICD-10-CM | POA: Insufficient documentation

## 2020-10-31 DIAGNOSIS — N760 Acute vaginitis: Secondary | ICD-10-CM | POA: Insufficient documentation

## 2020-10-31 DIAGNOSIS — B9689 Other specified bacterial agents as the cause of diseases classified elsewhere: Secondary | ICD-10-CM | POA: Diagnosis not present

## 2020-10-31 LAB — POCT WET PREP (WET MOUNT)
Clue Cells Wet Prep Whiff POC: POSITIVE
WBC, Wet Prep HPF POC: POSITIVE

## 2020-10-31 MED ORDER — METRONIDAZOLE 0.75 % VA GEL: 1.0000 | Freq: Every day | VAGINAL | 0 refills | Status: DC

## 2020-10-31 NOTE — Progress Notes (Signed)
  Subjective:     Patient ID: Nash Shearer, female   DOB: 04/08/1981, 40 y.o.   MRN: [id number]  HPI Julitza is a 40 year old black female, single,sp [facility] in 2019 in complaining of vaginal spotting.  Review of Systems +vaginal spotting Not having sex currently  Reviewed past medical,surgical, social and family history. Reviewed medications and allergies.     Objective:   Physical Exam BP 115/74 (BP Location: Left Arm, Patient Position: Sitting, Cuff Size: Large)   Pulse 78   Ht 5\' 4"  (1.626 m)   Wt 198 lb 8 oz (90 kg)   LMP 01/15/2018 Comment: [place]  BMI 34.07 kg/m  Skin warm and dry.Pelvic: external genitalia is normal in appearance no lesions, vagina: creamy discharge with odor,urethra has no lesions or masses noted, cervix:smooth, uterus is absent, adnexa: no masses or tenderness noted. Bladder is non tender and no masses felt. Wet prep: + for clue cells and +WBCs. CV swab obtained   Examination chaperoned by Levy Pupa LPN Fall risk is low  [facility] - 10/31/20 1134      Pregnancy Intention Screening   Does the patient want to become pregnant in the next year? No    Does the patient's partner want to become pregnant in the next year? No    Would the patient like to discuss contraceptive options today? No      Contraception Wrap Up   Current Method Female Sterilization   hyst   End Method Female Sterilization   hyst   Contraception Counseling Provided No          Assessment:     1. Vaginal spotting  2. S/P abdominal supracervical subtotal hysterectomy Explained to Twan that can have some bleeding from cervical stump after SCh  3. Vaginal discharge CV swab sent for GC/CHL,trich,BV and yeast   4. Vaginal odor  5. BV (bacterial vaginosis) Will rx metrogel  Meds ordered this encounter  Medications  . metroNIDAZOLE (METROGEL VAGINAL) 0.75 % vaginal gel    Sig: Place 1 Applicatorful vaginally at bedtime.    Dispense:  70 g    Refill:  0    Order Specific  Question:   Supervising Provider    Answer:   [name] [[id number]]      Plan:     Return as scheduled 12/06/20 for pap and physical with me

## 2020-11-01 LAB — CERVICOVAGINAL ANCILLARY ONLY
Bacterial Vaginitis (gardnerella): POSITIVE — AB
Candida Glabrata: NEGATIVE
Candida Vaginitis: NEGATIVE
Chlamydia: NEGATIVE
Comment: NEGATIVE
Comment: NEGATIVE
Comment: NEGATIVE
Comment: NEGATIVE
Comment: NEGATIVE
Comment: NORMAL
Neisseria Gonorrhea: NEGATIVE
Trichomonas: NEGATIVE

## 2020-12-06 ENCOUNTER — Other Ambulatory Visit (HOSPITAL_COMMUNITY)
Admission: RE | Admit: 2020-12-06 | Discharge: 2020-12-06 | Disposition: A | Discharge status: Home / Self Care | Payer: BC Managed Care – PPO | Source: Ambulatory Visit | Attending: Adult Health | Admitting: Adult Health

## 2020-12-06 ENCOUNTER — Ambulatory Visit (INDEPENDENT_AMBULATORY_CARE_PROVIDER_SITE_OTHER): Payer: BC Managed Care – PPO | Admitting: Adult Health

## 2020-12-06 ENCOUNTER — Encounter: Payer: Self-pay | Admitting: Adult Health

## 2020-12-06 ENCOUNTER — Other Ambulatory Visit: Payer: Self-pay

## 2020-12-06 VITALS — BP 134/84 | HR 63 | Ht 64.0 in | Wt 197.0 lb

## 2020-12-06 DIAGNOSIS — Z1211 Encounter for screening for malignant neoplasm of colon: Secondary | ICD-10-CM

## 2020-12-06 DIAGNOSIS — Z01419 Encounter for gynecological examination (general) (routine) without abnormal findings: Secondary | ICD-10-CM | POA: Diagnosis not present

## 2020-12-06 LAB — HEMOCCULT GUIAC POC 1CARD (OFFICE): Fecal Occult Blood, POC: NEGATIVE

## 2020-12-06 NOTE — Progress Notes (Signed)
Patient ID: Rachel Jefferson, female   DOB: Apr 22, 1981, 40 y.o.   MRN: [id number] History of Present Illness:  Abel is a 40 year old black female,single, [id number], in for a well woman gyn exam and pap.   Current Medications, Allergies, Past Medical History, Past Surgical History, Family History and Social History were reviewed in [employer] record.     Review of Systems: Patient denies any headaches, hearing loss, fatigue, blurred vision, shortness of breath, chest pain, abdominal pain, problems with bowel movements, urination, or intercourse. No joint pain or mood swings.    Physical Exam:BP 134/84 (BP Location: Left Arm, Patient Position: Sitting, Cuff Size: Normal)   Pulse 63   Ht 5\' 4"  (1.626 m)   Wt 197 lb (89.4 kg)   LMP 01/15/2018 Comment: [place]  BMI 33.81 kg/m  General:  Well developed, well nourished, no acute distress Skin:  Warm and dry Neck:  Midline trachea, normal thyroid, good ROM, no lymphadenopathy Lungs; Clear to auscultation bilaterally Breast:  No dominant palpable mass, retraction, or nipple discharge Cardiovascular: Regular rate and rhythm Abdomen:  Soft, non tender, no hepatosplenomegaly Pelvic:  External genitalia is normal in appearance, no lesions.  The vagina is normal in appearance. Urethra has no lesions or masses. The cervix is bulbous.Pap with HR HPV genotyping performed.   Uterus is absent.  No adnexal masses or tenderness noted.Bladder is non tender, no masses felt. Rectal: Good sphincter tone, no polyps, or hemorrhoids felt.  Hemoccult negative. Extremities/musculoskeletal:  No swelling or varicosities noted, no clubbing or cyanosis Psych:  No mood changes, alert and cooperative,seems happy AA is 2 Fall risk is low PHQ 9 score is 3 GAD 7 score is 4  [facility] - 12/06/20 1222      Pregnancy Intention Screening   Does the patient want to become pregnant in the next year? N/A    Does the patient's partner want to become  pregnant in the next year? N/A    Would the patient like to discuss contraceptive options today? N/A      Contraception Wrap Up   Current Method Female Sterilization   hyst   End Method Female Sterilization   hyst   Contraception Counseling Provided No         Examination chaperoned by Levy Pupa LPN   Impression and Plan: 1. Encounter for gynecological examination with Papanicolaou smear of cervix Pap sent Physical in 1 year Pap in 3 if normal Mammogram yearly Colonoscopy at [facility] Labs next year   2. Encounter for screening fecal occult blood testing

## 2020-12-11 LAB — CYTOLOGY - PAP
Comment: NEGATIVE
Diagnosis: NEGATIVE
High risk HPV: NEGATIVE

## 2021-01-08 ENCOUNTER — Ambulatory Visit
Admission: RE | Admit: 2021-01-08 | Discharge: 2021-01-08 | Disposition: A | Discharge status: Home / Self Care | Payer: BC Managed Care – PPO | Source: Ambulatory Visit | Attending: Family Medicine | Admitting: Family Medicine

## 2021-01-08 ENCOUNTER — Other Ambulatory Visit: Payer: Self-pay

## 2021-01-08 VITALS — BP 121/78 | HR 89 | Temp 98.3°F | Resp 18

## 2021-01-08 DIAGNOSIS — J0140 Acute pansinusitis, unspecified: Secondary | ICD-10-CM

## 2021-01-08 DIAGNOSIS — H66003 Acute suppurative otitis media without spontaneous rupture of ear drum, bilateral: Secondary | ICD-10-CM | POA: Diagnosis not present

## 2021-01-08 DIAGNOSIS — J209 Acute bronchitis, unspecified: Secondary | ICD-10-CM

## 2021-01-08 MED ORDER — VENTOLIN HFA 108 (90 BASE) MCG/ACT IN AERS: 2.0000 | INHALATION_SPRAY | RESPIRATORY_TRACT | 0 refills | Status: AC | PRN

## 2021-01-08 MED ORDER — ALBUTEROL SULFATE (2.5 MG/3ML) 0.083% IN NEBU: 2.5000 mg | INHALATION_SOLUTION | Freq: Four times a day (QID) | RESPIRATORY_TRACT | 0 refills | Status: AC | PRN

## 2021-01-08 MED ORDER — CEFDINIR 300 MG PO CAPS
300.0000 mg | ORAL_CAPSULE | Freq: Two times a day (BID) | ORAL | 0 refills | Status: AC
Start: 2021-01-08 — End: 2021-01-15

## 2021-01-08 NOTE — Discharge Instructions (Addendum)
I have sent in Cefdinir for you to take twice a day for 7 days.  I have sent in an albuterol inhaler for you to use 2 puffs every 4-6 hours as needed for cough, shortness of breath, wheezing.  Refilled nebulizer solution as well  Follow up with this office or with primary care if symptoms are persisting.  Follow up in the ER for high fever, trouble swallowing, trouble breathing, other concerning symptoms.

## 2021-01-08 NOTE — ED Triage Notes (Signed)
Pt presents with c/o  Nasal congestion and scratchy throat with dry cough since last week

## 2021-01-09 NOTE — ED Provider Notes (Signed)
[facility]    CSN: [id number] Arrival date & time: 01/08/21  1549      History   Chief Complaint Chief Complaint  Patient presents with   Nasal Congestion   Sore Throat    HPI Rachel Jefferson is a 40 y.o. female.   Reports nasal congestion, cough, wheezing, bilateral ear pain, sinus pain, for the last week. Has been taking OTC cough and cold. Denies sick contacts. Has positive hx Covid. Has not completed flu vaccine. Has not completed Covid vaccines. Denies abdominal pain, nausea, vomiting, diarrhea, rash, fever, other symptoms.   ROS per HPI  The history is provided by the patient.  Sore Throat   Past Medical History:  Diagnosis Date   Anxiety    Asthma    Bronchitis    Dyspnea    GERD (gastroesophageal reflux disease)    Insomnia    Trichimoniasis     Patient Active Problem List   Diagnosis Date Noted   Encounter for screening fecal occult blood testing 12/06/2020   Encounter for gynecological examination with Papanicolaou smear of cervix 12/06/2020   Screening for colorectal cancer 11/09/2019   Encounter for well woman exam with routine gynecological exam 11/09/2019   Vomiting 12/01/2018   GERD (gastroesophageal reflux disease) 09/02/2018   Dysphagia 09/02/2018   S/P abdominal supracervical subtotal hysterectomy 02/09/2018    Past Surgical History:  Procedure Laterality Date   ABDOMINAL HYSTERECTOMY     BILATERAL SALPINGECTOMY Bilateral 02/09/2018   Procedure: BILATERAL SALPINGECTOMY;  Surgeon: [name], MD;  Location: [facility];  Service: Gynecology;  Laterality: Bilateral;   ESOPHAGOGASTRODUODENOSCOPY (EGD) WITH PROPOFOL N/A 10/03/2018   Procedure: ESOPHAGOGASTRODUODENOSCOPY (EGD) WITH PROPOFOL;  Surgeon: Daneil Dolin, MD;  Location: [facility];  Service: Endoscopy;  Laterality: N/A;  1:45pm   MALONEY DILATION N/A 10/03/2018   Procedure: Venia Minks DILATION;  Surgeon: Daneil Dolin, MD;  Location: [facility];  Service: Endoscopy;   Laterality: N/A;   SUPRACERVICAL ABDOMINAL HYSTERECTOMY N/A 02/09/2018   Procedure: HYSTERECTOMY SUPRACERVICAL ABDOMINAL;  Surgeon: [name], MD;  Location: [facility];  Service: Gynecology;  Laterality: N/A;   TUBAL LIGATION     tubes tied      OB History     Gravida  5   Para  3   Term      Preterm      AB  2   Living  3      SAB      IAB  2   Ectopic      Multiple      Live Births               Home Medications    Prior to Admission medications   Medication Sig Start Date End Date Taking? Authorizing Provider  cefdinir (OMNICEF) 300 MG capsule Take 1 capsule (300 mg total) by mouth 2 (two) times daily for 7 days. 01/08/21 01/15/21 Yes Faustino Congress, NP  albuterol (PROVENTIL) (2.5 MG/3ML) 0.083% nebulizer solution Take 3 mLs (2.5 mg total) by nebulization every 6 (six) hours as needed for wheezing or shortness of breath. 01/08/21   Faustino Congress, NP  cetirizine (ZYRTEC) 10 MG tablet Take 1 tablet (10 mg total) by mouth daily. Patient taking differently: Take 10 mg by mouth as needed. 02/25/20   Wurst, [place], PA-C  fluticasone (FLONASE) 50 MCG/ACT nasal spray Place 2 sprays into both nostrils daily. 02/25/20   Wurst, [place], PA-C  metroNIDAZOLE (METROGEL VAGINAL) 0.75 %  vaginal gel Place 1 Applicatorful vaginally at bedtime. Patient not taking: Reported on 12/06/2020 10/31/20   Estill Dooms, NP  Multiple Vitamin (MULTIVITAMIN) tablet Take 1 tablet by mouth daily. Gummy    [provider]  Multiple Vitamins-Minerals (HAIR SKIN AND NAILS FORMULA PO) Take 2 tablets by mouth daily. Gummies    [provider]  VENTOLIN HFA 108 (90 Base) MCG/ACT inhaler Inhale 2 puffs into the lungs every 4 (four) hours as needed for wheezing or shortness of breath. 01/08/21   Faustino Congress, NP    Family History Family History  Problem Relation Age of Onset   Heart attack Paternal Grandfather    Diabetes Paternal Grandmother    Hypertension  Mother    Colon cancer Neg Hx    Gastric cancer Neg Hx    Esophageal cancer Neg Hx     Social History Social History   Tobacco Use   Smoking status: Never   Smokeless tobacco: Never  Vaping Use   Vaping Use: Never used  Substance Use Topics   Alcohol use: Yes    Comment: occasionally   Drug use: No     Allergies   Penicillins   Review of Systems Review of Systems   Physical Exam Triage Vital Signs ED Triage Vitals  Enc Vitals Group     BP 01/08/21 1605 121/78     Pulse Rate 01/08/21 1605 89     Resp 01/08/21 1605 18     Temp 01/08/21 1605 98.3 F (36.8 C)     Temp src --      SpO2 01/08/21 1605 97 %     Weight --      Height --      Head Circumference --      Peak Flow --      Pain Score 01/08/21 1604 0     Pain Loc --      Pain Edu? --      Excl. in [place]? --    No data found.  Updated Vital Signs BP 121/78   Pulse 89   Temp 98.3 F (36.8 C)   Resp 18   LMP 01/15/2018 Comment: [place]  SpO2 97%   Visual Acuity Right Eye Distance:   Left Eye Distance:   Bilateral Distance:    Right Eye Near:   Left Eye Near:    Bilateral Near:     Physical Exam Vitals and nursing note reviewed.  Constitutional:      General: She is not in acute distress.    Appearance: She is well-developed. She is obese.  HENT:     Head: Normocephalic and atraumatic.     Right Ear: Ear canal normal. A middle ear effusion is present. Tympanic membrane is erythematous.     Left Ear: Ear canal normal. A middle ear effusion is present. Tympanic membrane is erythematous.     Nose: Congestion present.     Mouth/Throat:     Mouth: Mucous membranes are moist.     Pharynx: Posterior oropharyngeal erythema present.  Eyes:     Conjunctiva/sclera: Conjunctivae normal.  Cardiovascular:     Rate and Rhythm: Normal rate and regular rhythm.     Heart sounds: Normal heart sounds. No murmur heard. Pulmonary:     Effort: Pulmonary effort is normal. No respiratory distress.     Breath  sounds: No stridor. Wheezing present. No rhonchi or rales.  Chest:     Chest wall: No tenderness.  Abdominal:  Palpations: Abdomen is soft.     Tenderness: There is no abdominal tenderness.  Musculoskeletal:     Cervical back: Normal range of motion and neck supple.  Lymphadenopathy:     Cervical: Cervical adenopathy present.  Skin:    General: Skin is warm and dry.     Capillary Refill: Capillary refill takes less than 2 seconds.  Neurological:     General: No focal deficit present.     Mental Status: She is alert and oriented to person, place, and time.  Psychiatric:        Mood and Affect: Mood normal.        Behavior: Behavior normal.     UC Treatments / Results  Labs (all labs ordered are listed, but only abnormal results are displayed) Labs Reviewed - No data to display  EKG   Radiology No results found.  Procedures Procedures (including critical care time)  Medications Ordered in UC Medications - No data to display  Initial Impression / Assessment and Plan / UC Course  I have reviewed the triage vital signs and the nursing notes.  Pertinent labs & imaging results that were available during my care of the patient were reviewed by me and considered in my medical decision making (see chart for details).    Bilateral Otitis Media Acute Pansinusitis Acute Bronchitis  Prescribed Cefdinir 300mg  BID x 7 days Refilled nebulizer solution and inhaler May take Motrin and tylenol for headaches, ear pain, etc Push fluids and get rest Follow up with this office or with primary care if symptoms are persisting.  Follow up in the ER for high fever, trouble swallowing, trouble breathing, other concerning symptoms.   Final Clinical Impressions(s) / UC Diagnoses   Final diagnoses:  Non-recurrent acute suppurative otitis media of both ears without spontaneous rupture of tympanic membranes  Acute bronchitis, unspecified organism  Acute non-recurrent pansinusitis      Discharge Instructions      I have sent in Cefdinir for you to take twice a day for 7 days.  I have sent in an albuterol inhaler for you to use 2 puffs every 4-6 hours as needed for cough, shortness of breath, wheezing.  Refilled nebulizer solution as well  Follow up with this office or with primary care if symptoms are persisting.  Follow up in the ER for high fever, trouble swallowing, trouble breathing, other concerning symptoms.      ED Prescriptions     Medication Sig Dispense Auth. Provider   VENTOLIN HFA 108 (90 Base) MCG/ACT inhaler Inhale 2 puffs into the lungs every 4 (four) hours as needed for wheezing or shortness of breath. 1 each Faustino Congress, NP   albuterol (PROVENTIL) (2.5 MG/3ML) 0.083% nebulizer solution Take 3 mLs (2.5 mg total) by nebulization every 6 (six) hours as needed for wheezing or shortness of breath. 75 mL Faustino Congress, NP   cefdinir (OMNICEF) 300 MG capsule Take 1 capsule (300 mg total) by mouth 2 (two) times daily for 7 days. 14 capsule Faustino Congress, NP      PDMP not reviewed this encounter.   Faustino Congress, NP 01/09/21 1753

## 2021-02-24 ENCOUNTER — Other Ambulatory Visit: Payer: Self-pay | Admitting: Adult Health

## 2021-02-24 DIAGNOSIS — R928 Other abnormal and inconclusive findings on diagnostic imaging of breast: Secondary | ICD-10-CM

## 2021-02-24 NOTE — Progress Notes (Signed)
Order placed for left breast US

## 2021-03-11 ENCOUNTER — Ambulatory Visit (HOSPITAL_COMMUNITY)
Admission: RE | Admit: 2021-03-11 | Discharge: 2021-03-11 | Disposition: A | Discharge status: Home / Self Care | Payer: BC Managed Care – PPO | Source: Ambulatory Visit | Attending: Adult Health | Admitting: Adult Health

## 2021-03-11 ENCOUNTER — Other Ambulatory Visit: Payer: Self-pay

## 2021-03-11 DIAGNOSIS — N6489 Other specified disorders of breast: Secondary | ICD-10-CM | POA: Diagnosis not present

## 2021-03-11 DIAGNOSIS — R928 Other abnormal and inconclusive findings on diagnostic imaging of breast: Secondary | ICD-10-CM | POA: Diagnosis not present

## 2021-07-17 ENCOUNTER — Other Ambulatory Visit: Payer: Self-pay

## 2021-07-17 MED ORDER — METRONIDAZOLE 0.75 % VA GEL: 1.0000 | Freq: Every day | VAGINAL | 0 refills | Status: DC

## 2021-07-24 ENCOUNTER — Ambulatory Visit: Payer: BC Managed Care – PPO | Admitting: Adult Health

## 2021-10-02 DIAGNOSIS — Z0001 Encounter for general adult medical examination with abnormal findings: Secondary | ICD-10-CM | POA: Diagnosis not present

## 2021-10-02 DIAGNOSIS — Z6838 Body mass index (BMI) 38.0-38.9, adult: Secondary | ICD-10-CM | POA: Diagnosis not present

## 2021-10-02 DIAGNOSIS — E663 Overweight: Secondary | ICD-10-CM | POA: Diagnosis not present

## 2021-10-02 DIAGNOSIS — F39 Unspecified mood [affective] disorder: Secondary | ICD-10-CM | POA: Diagnosis not present

## 2021-10-02 DIAGNOSIS — Z1331 Encounter for screening for depression: Secondary | ICD-10-CM | POA: Diagnosis not present

## 2021-10-16 DIAGNOSIS — Z0001 Encounter for general adult medical examination with abnormal findings: Secondary | ICD-10-CM | POA: Diagnosis not present

## 2021-10-29 ENCOUNTER — Other Ambulatory Visit (HOSPITAL_COMMUNITY): Payer: Self-pay | Admitting: Adult Health

## 2021-10-29 DIAGNOSIS — N6011 Diffuse cystic mastopathy of right breast: Secondary | ICD-10-CM

## 2021-10-29 DIAGNOSIS — N6002 Solitary cyst of left breast: Secondary | ICD-10-CM

## 2021-11-10 ENCOUNTER — Encounter (HOSPITAL_COMMUNITY): Payer: BC Managed Care – PPO

## 2021-11-10 DIAGNOSIS — Z1231 Encounter for screening mammogram for malignant neoplasm of breast: Secondary | ICD-10-CM

## 2021-11-27 ENCOUNTER — Ambulatory Visit (HOSPITAL_COMMUNITY)
Admission: RE | Admit: 2021-11-27 | Discharge: 2021-11-27 | Disposition: A | Discharge status: Home / Self Care | Payer: BC Managed Care – PPO | Source: Ambulatory Visit | Attending: Adult Health | Admitting: Adult Health

## 2021-11-27 ENCOUNTER — Encounter (HOSPITAL_COMMUNITY): Payer: Self-pay

## 2021-11-27 DIAGNOSIS — N6002 Solitary cyst of left breast: Secondary | ICD-10-CM

## 2021-11-27 DIAGNOSIS — R928 Other abnormal and inconclusive findings on diagnostic imaging of breast: Secondary | ICD-10-CM | POA: Diagnosis not present

## 2021-12-15 ENCOUNTER — Encounter: Payer: Self-pay | Admitting: Adult Health

## 2021-12-15 ENCOUNTER — Ambulatory Visit (INDEPENDENT_AMBULATORY_CARE_PROVIDER_SITE_OTHER): Payer: BC Managed Care – PPO | Admitting: Adult Health

## 2021-12-15 VITALS — BP 109/69 | HR 84 | Ht 64.0 in | Wt 220.0 lb

## 2021-12-15 DIAGNOSIS — F419 Anxiety disorder, unspecified: Secondary | ICD-10-CM | POA: Diagnosis not present

## 2021-12-15 DIAGNOSIS — Z90711 Acquired absence of uterus with remaining cervical stump: Secondary | ICD-10-CM

## 2021-12-15 DIAGNOSIS — Z1211 Encounter for screening for malignant neoplasm of colon: Secondary | ICD-10-CM

## 2021-12-15 DIAGNOSIS — Z01419 Encounter for gynecological examination (general) (routine) without abnormal findings: Secondary | ICD-10-CM

## 2021-12-15 DIAGNOSIS — F32A Depression, unspecified: Secondary | ICD-10-CM

## 2021-12-15 LAB — HEMOCCULT GUIAC POC 1CARD (OFFICE): Fecal Occult Blood, POC: NEGATIVE

## 2021-12-15 NOTE — Progress Notes (Signed)
Patient ID: Rachel Jefferson, female   DOB: 29-Dec-1980, 41 y.o.   MRN: [id number] ?History of Present Illness: ?Lucillie Garfinkel is a 41 year old black female,single, sp [facility] in for well woman gyn exam. ?She has some some spotting, but nit much. ? ?Lab Results  ?Component Value Date  ? DIAGPAP  12/06/2020  ?  - Negative for intraepithelial lesion or malignancy (NILM)  ? HPV NOT DETECTED 11/09/2017  ? [place] Negative 12/06/2020  ?  ?Current Medications, Allergies, Past Medical History, Past Surgical History, Family History and Social History were reviewed in [employer] record.   ? ? ?Review of Systems: ?Patient denies any headaches, hearing loss, fatigue, blurred vision, shortness of breath, chest pain, abdominal pain, problems with bowel movements, urination, or intercourse. No joint pain or mood swings.  ?Some spotting ? ? ?Physical Exam:BP 109/69 (BP Location: Left Arm, Patient Position: Sitting, Cuff Size: Large)   Pulse 84   Ht '5\' 4"'$  (1.626 m)   Wt 220 lb (99.8 kg)   LMP 01/15/2018 Comment: [place]  BMI 37.76 kg/m?   ?General:  Well developed, well nourished, no acute distress ?Skin:  Warm and dry ?Neck:  Midline trachea, normal thyroid, good ROM, no lymphadenopathy ?Lungs; Clear to auscultation bilaterally ?Breast:  No dominant palpable mass, retraction, or nipple discharge ?Cardiovascular: Regular rate and rhythm ?Abdomen:  Soft, non tender, no hepatosplenomegaly ?Pelvic:  External genitalia is normal in appearance, no lesions.  The vagina is normal in appearance. Urethra has no lesions or masses. The cervix is smooth.  Uterus is absent.  No adnexal masses or tenderness noted.Bladder is non tender, no masses felt. ?Rectal: Good sphincter tone, no polyps, or hemorrhoids felt.  Hemoccult negative. ?Extremities/musculoskeletal:  No swelling or varicosities noted, no clubbing or cyanosis ?Psych:  No mood changes, alert and cooperative,seems happy ?AA is 5 ?Fall risk is low ? ?  12/15/2021  ?  3:40 PM  12/06/2020  ? 12:23 PM 11/09/2019  ? 10:46 AM  ?Depression screen PHQ 2/9  ?Decreased Interest 2 0 0  ?Down, Depressed, Hopeless 1 0 1  ?PHQ - 2 Score 3 0 1  ?Altered sleeping '2 1 1  '$ ?Tired, decreased energy '2 2 1  '$ ?Change in appetite 2 0 0  ?Feeling bad or failure about yourself  0 0 0  ?Trouble concentrating 1 0 0  ?Moving slowly or fidgety/restless 0 0 0  ?Suicidal thoughts 0 0 0  ?PHQ-9 Score '10 3 3  '$ ?  ? ?  12/15/2021  ?  3:40 PM 12/06/2020  ? 12:23 PM 11/09/2019  ? 10:46 AM  ?GAD 7 : Generalized Anxiety Score  ?Nervous, Anxious, on Edge 1 0 0  ?Control/stop worrying 1 1 0  ?Worry too much - different things 1 1 0  ?Trouble relaxing 1 1 0  ?Restless 0 0 1  ?Easily annoyed or irritable 1 1 0  ?Afraid - awful might happen 0 0 0  ?Total GAD 7 Score '5 4 1  '$ ? ? [place] - 12/15/21 1537   ? ?  ? Pregnancy Intention Screening  ? Does the patient want to become pregnant in the next year? N/A   ? Does the patient's partner want to become pregnant in the next year? N/A   ? Would the patient like to discuss contraceptive options today? N/A   ?  ? Contraception Wrap Up  ? Current Method Female Sterilization   [facility]  ? End Method Female Sterilization   [facility]  ?  Contraception Counseling Provided No   ? ?  ?  ? ?  ?  ? Examination chaperoned by Marcelino Scot RN ? ?Impression and Plan: ?1. Encounter for well woman exam with routine gynecological exam ?Physical in 1 year ?Pap 2025 ?Mammogram yearly ?Colonoscopy at 42  ? ?2. S/P abdominal supracervical subtotal hysterectomy ? ?3. Encounter for screening fecal occult blood testing ?Hemoccult is negative  ? ?4. Anxiety and depression ?She has zoloft 50 mg and takes occasionally but not daily, try daily  ? ? ? ?  ?  ?

## 2022-01-10 ENCOUNTER — Encounter: Payer: Self-pay | Admitting: Emergency Medicine

## 2022-01-10 ENCOUNTER — Ambulatory Visit
Admission: EM | Admit: 2022-01-10 | Discharge: 2022-01-10 | Disposition: A | Discharge status: Home / Self Care | Payer: BC Managed Care – PPO | Attending: Nurse Practitioner | Admitting: Nurse Practitioner

## 2022-01-10 DIAGNOSIS — J029 Acute pharyngitis, unspecified: Secondary | ICD-10-CM | POA: Diagnosis not present

## 2022-01-10 DIAGNOSIS — B349 Viral infection, unspecified: Secondary | ICD-10-CM | POA: Diagnosis not present

## 2022-01-10 LAB — POCT RAPID STREP A (OFFICE): Rapid Strep A Screen: NEGATIVE

## 2022-01-10 MED ORDER — PROMETHAZINE-DM 6.25-15 MG/5ML PO SYRP
5.0000 mL | ORAL_SOLUTION | Freq: Four times a day (QID) | ORAL | 0 refills | Status: DC | PRN
Start: 2022-01-10 — End: 2023-06-03

## 2022-01-10 MED ORDER — LIDOCAINE VISCOUS HCL 2 % MT SOLN: 10.0000 mL | Freq: Three times a day (TID) | OROMUCOSAL | 0 refills | Status: DC | PRN

## 2022-01-10 NOTE — Discharge Instructions (Addendum)
The rapid strep test was negative.  Throat culture, COVID/flu test are pending at this time.  You will be contacted if the results are positive. Take medication as prescribed. Increase fluids and allow for plenty of rest. Recommend Tylenol or ibuprofen as needed for pain, fever, or general discomfort. Warm salt water gargles 3-4 times daily to help with throat pain or discomfort. Recommend using a humidifier at bedtime during sleep to help with cough and nasal congestion. Sleep elevated on 2 pillows. Follow-up if your symptoms worsen or do not improve within the next 7 to 10 days.

## 2022-01-10 NOTE — ED Provider Notes (Signed)
[facility]    CSN: [id number] Arrival date & time: 01/10/22  1446      History   Chief Complaint No chief complaint on file.   HPI Rachel Jefferson is a 41 y.o. female.   HPI  Patient presents for complaints of sore throat, headache, cough, wheezing, and chills x1 day.  She also complains of generalized fatigue.  She denies fever, nasal congestion, runny nose, shortness of breath, or GI symptoms.  Patient states that she does have a history of asthma.  She has been taking Benadryl for her symptoms.  She denies any known sick contacts. Past Medical History:  Diagnosis Date   Anxiety    Asthma    Bronchitis    Dyspnea    GERD (gastroesophageal reflux disease)    Insomnia    Trichimoniasis     Patient Active Problem List   Diagnosis Date Noted   Anxiety and depression 12/15/2021   Encounter for screening fecal occult blood testing 12/06/2020   Encounter for gynecological examination with Papanicolaou smear of cervix 12/06/2020   Screening for colorectal cancer 11/09/2019   Encounter for well woman exam with routine gynecological exam 11/09/2019   Vomiting 12/01/2018   GERD (gastroesophageal reflux disease) 09/02/2018   Dysphagia 09/02/2018   S/P abdominal supracervical subtotal hysterectomy 02/09/2018    Past Surgical History:  Procedure Laterality Date   ABDOMINAL HYSTERECTOMY     BILATERAL SALPINGECTOMY Bilateral 02/09/2018   Procedure: BILATERAL SALPINGECTOMY;  Surgeon: [name], MD;  Location: [facility];  Service: Gynecology;  Laterality: Bilateral;   ESOPHAGOGASTRODUODENOSCOPY (EGD) WITH PROPOFOL N/A 10/03/2018   Procedure: ESOPHAGOGASTRODUODENOSCOPY (EGD) WITH PROPOFOL;  Surgeon: Daneil Dolin, MD;  [facility];  Service: Endoscopy;  Laterality: N/A;  1:45pm   MALONEY DILATION N/A 10/03/2018   Procedure: Venia Minks DILATION;  Surgeon: Daneil Dolin, MD;  [facility];  Service: Endoscopy;  Laterality: N/A;   SUPRACERVICAL  ABDOMINAL HYSTERECTOMY N/A 02/09/2018   Procedure: HYSTERECTOMY SUPRACERVICAL ABDOMINAL;  Surgeon: [name], MD;  Location: [facility];  Service: Gynecology;  Laterality: N/A;   TUBAL LIGATION     tubes tied      OB History     Gravida  5   Para  3   Term      Preterm      AB  2   Living  3      SAB      IAB  2   Ectopic      Multiple      Live Births               Home Medications    Prior to Admission medications   Medication Sig Start Date End Date Taking? Authorizing Provider  lidocaine (XYLOCAINE) 2 % solution Use as directed 10 mLs in the mouth or throat every 8 (eight) hours as needed for mouth pain. 01/10/22  Yes Khyson Sebesta-Warren, Alda Lea, NP  promethazine-dextromethorphan (PROMETHAZINE-DM) 6.25-15 MG/5ML syrup Take 5 mLs by mouth 4 (four) times daily as needed for cough. 01/10/22  Yes Johnanna Bakke-Warren, Alda Lea, NP  albuterol (PROVENTIL) (2.5 MG/3ML) 0.083% nebulizer solution Take 3 mLs (2.5 mg total) by nebulization every 6 (six) hours as needed for wheezing or shortness of breath. 01/08/21   Faustino Congress, NP  cetirizine (ZYRTEC) 10 MG tablet Take 1 tablet (10 mg total) by mouth daily. Patient taking differently: Take 10 mg by mouth as needed. 02/25/20   Lestine Box, PA-C  fluticasone (FLONASE) 50 MCG/ACT nasal spray Place 2 sprays into both nostrils daily. 02/25/20   Wurst, [place], PA-C  montelukast (SINGULAIR) 10 MG tablet Take 10 mg by mouth daily. 10/31/21   [provider]  Multiple Vitamin (MULTIVITAMIN) tablet Take 1 tablet by mouth daily. Gummy    [provider]  Multiple Vitamins-Minerals (HAIR SKIN AND NAILS FORMULA PO) Take 2 tablets by mouth daily. Gummies    [provider]  sertraline (ZOLOFT) 50 MG tablet Take 50 mg by mouth daily. 10/02/21   [provider]  VENTOLIN HFA 108 (90 Base) MCG/ACT inhaler Inhale 2 puffs into the lungs every 4 (four) hours as needed for wheezing or shortness of breath.  01/08/21   Faustino Congress, NP    Family History Family History  Problem Relation Age of Onset   Heart attack Paternal Grandfather    Diabetes Paternal Grandmother    Hypertension Mother    Colon cancer Neg Hx    Gastric cancer Neg Hx    Esophageal cancer Neg Hx     Social History Social History   Tobacco Use   Smoking status: Never   Smokeless tobacco: Never  Vaping Use   Vaping Use: Never used  Substance Use Topics   Alcohol use: Yes    Comment: occasionally   Drug use: No     Allergies   Penicillins   Review of Systems Review of Systems Per HPI  Physical Exam Triage Vital Signs ED Triage Vitals  Enc Vitals Group     BP 01/10/22 1519 120/82     Pulse Rate 01/10/22 1519 93     Resp 01/10/22 1519 18     Temp 01/10/22 1519 99.3 F (37.4 C)     Temp Source 01/10/22 1519 Oral     SpO2 01/10/22 1519 94 %     Weight --      Height --      Head Circumference --      Peak Flow --      Pain Score 01/10/22 1521 8     Pain Loc --      Pain Edu? --      Excl. in [place]? --    No data found.  Updated Vital Signs BP 120/82 (BP Location: Right Arm)   Pulse 93   Temp 99.3 F (37.4 C) (Oral)   Resp 18   LMP 01/15/2018 Comment: [place]  SpO2 94%   Visual Acuity Right Eye Distance:   Left Eye Distance:   Bilateral Distance:    Right Eye Near:   Left Eye Near:    Bilateral Near:     Physical Exam Vitals and nursing note reviewed.  Constitutional:      Appearance: Normal appearance. She is well-developed.  HENT:     Head: Normocephalic and atraumatic.     Nose: Nose normal.     Mouth/Throat:     Lips: Pink.     Mouth: Mucous membranes are moist.     Pharynx: Uvula midline. Posterior oropharyngeal erythema present. No oropharyngeal exudate.     Tonsils: 1+ on the right. 1+ on the left.  Eyes:     Conjunctiva/sclera: Conjunctivae normal.     Pupils: Pupils are equal, round, and reactive to light.  Neck:     Thyroid: No thyromegaly.     Trachea: No  tracheal deviation.  Cardiovascular:     Rate and Rhythm: Normal rate and regular rhythm.     Pulses: Normal pulses.  Heart sounds: Normal heart sounds.  Pulmonary:     Effort: Pulmonary effort is normal.     Breath sounds: Normal breath sounds.  Abdominal:     General: Bowel sounds are normal. There is no distension.     Palpations: Abdomen is soft.     Tenderness: There is no abdominal tenderness.  Musculoskeletal:     Cervical back: Normal range of motion and neck supple.  Skin:    General: Skin is warm and dry.  Neurological:     General: No focal deficit present.     Mental Status: She is alert and oriented to person, place, and time.  Psychiatric:        Mood and Affect: Mood normal.        Behavior: Behavior normal.        Thought Content: Thought content normal.        Judgment: Judgment normal.      UC Treatments / Results  Labs (all labs ordered are listed, but only abnormal results are displayed) Labs Reviewed  CULTURE, GROUP A STREP ([place])  COVID-19, FLU A+B NAA  POCT RAPID STREP A (OFFICE)    EKG   Radiology No results found.  Procedures Procedures (including critical care time)  Medications Ordered in UC Medications - No data to display  Initial Impression / Assessment and Plan / UC Course  I have reviewed the triage vital signs and the nursing notes.  Pertinent labs & imaging results that were available during my care of the patient were reviewed by me and considered in my medical decision making (see chart for details).  Patient's symptoms started 1 day ago.  Her exam and vital signs reassuring.  Rapid strep negative, throat culture and COVID and flu testing pending.  Treat with viscous lidocaine, over-the-counter fever reducers, supportive care.  Will also provide a prescription for promethazine DM to help with cough.  Strict return precautions were provided, patient advised to follow-up as needed.  Final Clinical Impressions(s) / UC  Diagnoses   Final diagnoses:  Sore throat  Viral illness     Discharge Instructions      The rapid strep test was negative.  Throat culture, COVID/flu test are pending at this time.  You will be contacted if the results are positive. Take medication as prescribed. Increase fluids and allow for plenty of rest. Recommend Tylenol or ibuprofen as needed for pain, fever, or general discomfort. Warm salt water gargles 3-4 times daily to help with throat pain or discomfort. Recommend using a humidifier at bedtime during sleep to help with cough and nasal congestion. Sleep elevated on 2 pillows. Follow-up if your symptoms worsen or do not improve within the next 7 to 10 days.     ED Prescriptions     Medication Sig Dispense Auth. Provider   lidocaine (XYLOCAINE) 2 % solution Use as directed 10 mLs in the mouth or throat every 8 (eight) hours as needed for mouth pain. 75 mL Fareeha Evon-Warren, Alda Lea, NP   promethazine-dextromethorphan (PROMETHAZINE-DM) 6.25-15 MG/5ML syrup Take 5 mLs by mouth 4 (four) times daily as needed for cough. 140 mL Britni Driscoll-Warren, Alda Lea, NP      PDMP not reviewed this encounter.   Tish Men, NP 01/10/22 1550

## 2022-01-10 NOTE — ED Triage Notes (Signed)
Sore throat, headache, wheezing and cold chills since yesterday.  Feels fatigued.

## 2022-01-11 LAB — COVID-19, FLU A+B NAA
Influenza A, NAA: NOT DETECTED
Influenza B, NAA: NOT DETECTED
SARS-CoV-2, NAA: NOT DETECTED

## 2022-01-13 LAB — CULTURE, GROUP A STREP (THRC)

## 2022-02-10 DIAGNOSIS — J019 Acute sinusitis, unspecified: Secondary | ICD-10-CM | POA: Diagnosis not present

## 2022-02-10 DIAGNOSIS — J309 Allergic rhinitis, unspecified: Secondary | ICD-10-CM | POA: Diagnosis not present

## 2022-03-16 ENCOUNTER — Emergency Department (HOSPITAL_COMMUNITY): Payer: BC Managed Care – PPO

## 2022-03-16 ENCOUNTER — Encounter (HOSPITAL_COMMUNITY): Payer: Self-pay | Admitting: Emergency Medicine

## 2022-03-16 ENCOUNTER — Other Ambulatory Visit: Payer: Self-pay

## 2022-03-16 ENCOUNTER — Emergency Department (HOSPITAL_COMMUNITY)
Admission: EM | Admit: 2022-03-16 | Discharge: 2022-03-16 | Disposition: A | Discharge status: Home / Self Care | Payer: BC Managed Care – PPO | Attending: Emergency Medicine | Admitting: Emergency Medicine

## 2022-03-16 DIAGNOSIS — R112 Nausea with vomiting, unspecified: Secondary | ICD-10-CM | POA: Diagnosis not present

## 2022-03-16 DIAGNOSIS — R519 Headache, unspecified: Secondary | ICD-10-CM | POA: Insufficient documentation

## 2022-03-16 DIAGNOSIS — R1084 Generalized abdominal pain: Secondary | ICD-10-CM | POA: Diagnosis not present

## 2022-03-16 DIAGNOSIS — J45909 Unspecified asthma, uncomplicated: Secondary | ICD-10-CM | POA: Insufficient documentation

## 2022-03-16 DIAGNOSIS — R109 Unspecified abdominal pain: Secondary | ICD-10-CM | POA: Diagnosis not present

## 2022-03-16 DIAGNOSIS — R9431 Abnormal electrocardiogram [ECG] [EKG]: Secondary | ICD-10-CM | POA: Diagnosis not present

## 2022-03-16 DIAGNOSIS — K219 Gastro-esophageal reflux disease without esophagitis: Secondary | ICD-10-CM | POA: Insufficient documentation

## 2022-03-16 LAB — COMPREHENSIVE METABOLIC PANEL
ALT: 15 U/L (ref 0–44)
AST: 15 U/L (ref 15–41)
Albumin: 3.7 g/dL (ref 3.5–5.0)
Alkaline Phosphatase: 77 U/L (ref 38–126)
Anion gap: 7 (ref 5–15)
BUN: 10 mg/dL (ref 6–20)
CO2: 26 mmol/L (ref 22–32)
Calcium: 9.2 mg/dL (ref 8.9–10.3)
Chloride: 105 mmol/L (ref 98–111)
Creatinine, Ser: 0.74 mg/dL (ref 0.44–1.00)
GFR, Estimated: >60 mL/min (ref >60)
Glucose, Bld: 94 mg/dL (ref 70–99)
Potassium: 3.5 mmol/L (ref 3.5–5.1)
Sodium: 138 mmol/L (ref 135–145)
Total Bilirubin: 0.4 mg/dL (ref 0.3–1.2)
Total Protein: 7.8 g/dL (ref 6.5–8.1)

## 2022-03-16 LAB — CBC
HCT: 43.3 % (ref 36.0–46.0)
Hemoglobin: 13.9 g/dL (ref 12.0–15.0)
MCH: 27 pg (ref 26.0–34.0)
MCHC: 32.1 g/dL (ref 30.0–36.0)
MCV: 84.1 fL (ref 80.0–100.0)
Platelets: 333 10*3/uL (ref 150–400)
RBC: 5.15 MIL/uL — ABNORMAL HIGH (ref 3.87–5.11)
RDW: 13.6 % (ref 11.5–15.5)
WBC: 10.3 10*3/uL (ref 4.0–10.5)
nRBC: 0 % (ref 0.0–0.2)

## 2022-03-16 LAB — LIPASE, BLOOD: Lipase: 32 U/L (ref 11–51)

## 2022-03-16 MED ORDER — ONDANSETRON HCL 4 MG PO TABS
4.0000 mg | ORAL_TABLET | Freq: Three times a day (TID) | ORAL | 0 refills | Status: DC | PRN
Start: 2022-03-16 — End: 2023-06-03

## 2022-03-16 MED ORDER — ACETAMINOPHEN 500 MG PO TABS
1000.0000 mg | ORAL_TABLET | Freq: Four times a day (QID) | ORAL | Status: DC | PRN
Start: 2022-03-16 — End: 2022-03-17
  Administered 2022-03-16: 1000 mg via ORAL
  Filled 2022-03-16: qty 2

## 2022-03-16 MED ORDER — ONDANSETRON 8 MG PO TBDP
8.0000 mg | ORAL_TABLET | Freq: Once | ORAL | Status: AC
Start: 2022-03-16 — End: 2022-03-16
  Administered 2022-03-16: 8 mg via ORAL
  Filled 2022-03-16: qty 1

## 2022-03-16 NOTE — ED Triage Notes (Signed)
Pt presents with abdominal pain with vomiting off and on for approx 1 month, headache and chest pain started today, history of GERD.

## 2022-03-16 NOTE — ED Provider Notes (Signed)
[facility] EMERGENCY DEPARTMENT Provider Note   CSN: [id number] Arrival date & time: 03/16/22  1648     History  Chief Complaint  Patient presents with   Abdominal Pain    Rachel Jefferson is a 41 y.o. female with medical history of anxiety, GERD, asthma.  Patient presents to ED for evaluation of nausea, vomiting, headache.  Patient reports that for the last 1 month she has had intermittent nausea and vomiting most days.  Patient reports that when she vomits, she has chest pain that is associated with vomiting.  The patient denies any chest pain while she is not vomiting.  Patient states that she believed that her excessive vomiting was in part due to her GERD, she has not sought medical attention for this excessive vomiting that has been taking place over the course of 1 month.  Patient reports that she presented today due to headache that she developed.  Patient denies history of headaches.  Patient states this headache is "very bad".  The patient denies trying any over-the-counter medications to alleviate her headache. Patient triage note stated that she had abdominal pain however on interview, the patient denies any abdominal pain.  The patient denies any fevers, diarrhea, unilateral weakness or numbness, chest pain not associated with vomiting, shortness of breath, leg swelling.   Abdominal Pain Associated symptoms: nausea and vomiting   Associated symptoms: no chest pain, no chills, no diarrhea, no fever and no shortness of breath        Home Medications Prior to Admission medications   Medication Sig Start Date End Date Taking? Authorizing Provider  albuterol (PROVENTIL) (2.5 MG/3ML) 0.083% nebulizer solution Take 3 mLs (2.5 mg total) by nebulization every 6 (six) hours as needed for wheezing or shortness of breath. 01/08/21   Faustino Congress, NP  cetirizine (ZYRTEC) 10 MG tablet Take 1 tablet (10 mg total) by mouth daily. Patient taking differently: Take 10 mg by mouth as  needed. 02/25/20   Wurst, [place], PA-C  fluticasone (FLONASE) 50 MCG/ACT nasal spray Place 2 sprays into both nostrils daily. 02/25/20   Wurst, [place], PA-C  lidocaine (XYLOCAINE) 2 % solution Use as directed 10 mLs in the mouth or throat every 8 (eight) hours as needed for mouth pain. 01/10/22   Leath-Warren, Alda Lea, NP  montelukast (SINGULAIR) 10 MG tablet Take 10 mg by mouth daily. 10/31/21   [provider]  Multiple Vitamin (MULTIVITAMIN) tablet Take 1 tablet by mouth daily. Gummy    [provider]  Multiple Vitamins-Minerals (HAIR SKIN AND NAILS FORMULA PO) Take 2 tablets by mouth daily. Gummies    [provider]  promethazine-dextromethorphan (PROMETHAZINE-DM) 6.25-15 MG/5ML syrup Take 5 mLs by mouth 4 (four) times daily as needed for cough. 01/10/22   Leath-Warren, Alda Lea, NP  sertraline (ZOLOFT) 50 MG tablet Take 50 mg by mouth daily. 10/02/21   [provider]  VENTOLIN HFA 108 (90 Base) MCG/ACT inhaler Inhale 2 puffs into the lungs every 4 (four) hours as needed for wheezing or shortness of breath. 01/08/21   Faustino Congress, NP      Allergies    Penicillins    Review of Systems   Review of Systems  Constitutional:  Negative for chills and fever.  Respiratory:  Negative for shortness of breath.   Cardiovascular:  Negative for chest pain and leg swelling.  Gastrointestinal:  Positive for abdominal pain, nausea and vomiting. Negative for diarrhea.  Neurological:  Negative for weakness and numbness.  All other  systems reviewed and are negative.   Physical Exam Updated Vital Signs BP 100/70 (BP Location: Left Arm)   Pulse 63   Temp 98.1 F (36.7 C) (Oral)   Resp 15   Ht '5\' 4"'$  (1.626 m)   Wt 98.9 kg   LMP 01/15/2018 Comment: [place]  SpO2 100%   BMI 37.42 kg/m  Physical Exam Vitals and nursing note reviewed.  Constitutional:      General: She is not in acute distress.    Appearance: Normal appearance. She is well-developed. She  is not ill-appearing, toxic-appearing or diaphoretic.  HENT:     Head: Normocephalic and atraumatic.     Nose: Nose normal. No congestion.     Mouth/Throat:     Mouth: Mucous membranes are moist.     Pharynx: Oropharynx is clear.  Eyes:     Extraocular Movements: Extraocular movements intact.     Pupils: Pupils are equal, round, and reactive to light.  Cardiovascular:     Rate and Rhythm: Normal rate and regular rhythm.  Pulmonary:     Effort: Pulmonary effort is normal.     Breath sounds: Normal breath sounds. No wheezing.  Abdominal:     General: Abdomen is flat. Bowel sounds are normal.     Palpations: Abdomen is soft.     Tenderness: There is abdominal tenderness (Generalized abdominal tenderness).  Musculoskeletal:     Cervical back: Normal range of motion and neck supple. No tenderness.  Skin:    General: Skin is warm and dry.     Capillary Refill: Capillary refill takes less than 2 seconds.  Neurological:     General: No focal deficit present.     Mental Status: She is alert and oriented to person, place, and time.     GCS: GCS eye subscore is 4. GCS verbal subscore is 5. GCS motor subscore is 6.     Cranial Nerves: Cranial nerves 2-12 are intact. No cranial nerve deficit.     Sensory: Sensation is intact. No sensory deficit.     Motor: Motor function is intact. No weakness.     Coordination: Coordination is intact. Heel to Shin Test normal.     ED Results / Procedures / Treatments   Labs (all labs ordered are listed, but only abnormal results are displayed) Labs Reviewed  CBC - Abnormal; Notable for the following components:      Result Value   RBC 5.15 (*)    All other components within normal limits  LIPASE, BLOOD  COMPREHENSIVE METABOLIC PANEL  URINALYSIS, ROUTINE W REFLEX MICROSCOPIC    EKG None  Radiology CT Head Wo Contrast  Result Date: 03/16/2022 CLINICAL DATA:  Chronic headaches EXAM: CT HEAD WITHOUT CONTRAST TECHNIQUE: Contiguous axial images  were obtained from the base of the skull through the vertex without intravenous contrast. RADIATION DOSE REDUCTION: This exam was performed according to the departmental dose-optimization program which includes automated exposure control, adjustment of the mA and/or kV according to patient size and/or use of iterative reconstruction technique. COMPARISON:  11/03/2006 FINDINGS: Brain: No evidence of acute infarction, hemorrhage, hydrocephalus, extra-axial collection or mass lesion/mass effect. Vascular: No hyperdense vessel or unexpected calcification. Skull: Normal. Negative for fracture or focal lesion. Sinuses/Orbits: No acute finding. Other: None. IMPRESSION: No acute intracranial abnormality noted. Electronically Signed   By: Inez Catalina M.D.   On: 03/16/2022 20:43    Procedures Procedures   Medications Ordered in ED Medications  acetaminophen (TYLENOL) tablet 1,000 mg (1,000 mg Oral Given  03/16/22 2011)  ondansetron (ZOFRAN-ODT) disintegrating tablet 8 mg (8 mg Oral Given 03/16/22 2011)    ED Course/ Medical Decision Making/ A&P                           Medical Decision Making Amount and/or Complexity of Data Reviewed Labs: ordered. Radiology: ordered.  Risk OTC drugs. Prescription drug management.   41 year old female presents to the ED for evaluation.  Please see HPI for further details.  On examination, the patient is afebrile and nontachycardic.  The patient lung sounds are clear bilaterally, she is not hypoxic on room air.  The patient abdomen is soft and compressible in all 4 quadrants.  The patient neurological examination shows no focal neurodeficits.  The patient is nontoxic in appearance.  Patient worked up utilizing the following labs and imaging studies interpreted by me personally: - CMP unremarkable - CBC unremarkable - Lipase unremarkable - Urinalysis was not provided, the patient deferred this - CT without contrast shows no intracranial abnormality, midline  shift, herniation - EKG nonischemic  Patient given 8 mg Zofran, 1 g Tylenol for headache.  Patient states she feels better after the medications were administered.  At this time, the patient is stable for discharge.  The patient will be discharged home and advised to continue replete fluids as an outpatient.  Patient provided with return precautions and she voiced understanding.  Patient all her questions answered to her satisfaction prior to discharge.  The patient is stable at this time for discharge home.  Final Clinical Impression(s) / ED Diagnoses Final diagnoses:  Generalized abdominal pain    Rx / DC Orders ED Discharge Orders     None         Lawana Chambers 03/16/22 2202    Godfrey Pick, MD 03/19/22 0140

## 2022-03-16 NOTE — Discharge Instructions (Signed)
Please return to the ED with any new or worsening signs or symptoms Please pick up prescription Zofran I have sent in for you Please read the attached guide concerning abdominal pain Please follow-up with the PCP I referred you to.  Please call and make an appointment to be seen.

## 2022-04-10 ENCOUNTER — Other Ambulatory Visit: Payer: Self-pay | Admitting: Family Medicine

## 2022-04-10 ENCOUNTER — Ambulatory Visit (HOSPITAL_COMMUNITY)
Admission: RE | Admit: 2022-04-10 | Discharge: 2022-04-10 | Disposition: A | Discharge status: Home / Self Care | Payer: BC Managed Care – PPO | Source: Ambulatory Visit | Attending: Family Medicine | Admitting: Family Medicine

## 2022-04-10 ENCOUNTER — Other Ambulatory Visit (HOSPITAL_COMMUNITY): Payer: Self-pay | Admitting: Family Medicine

## 2022-04-10 DIAGNOSIS — M79605 Pain in left leg: Secondary | ICD-10-CM

## 2022-04-10 DIAGNOSIS — E6609 Other obesity due to excess calories: Secondary | ICD-10-CM | POA: Diagnosis not present

## 2022-04-10 DIAGNOSIS — Z6838 Body mass index (BMI) 38.0-38.9, adult: Secondary | ICD-10-CM | POA: Diagnosis not present

## 2022-04-10 DIAGNOSIS — M79662 Pain in left lower leg: Secondary | ICD-10-CM | POA: Diagnosis not present

## 2022-05-06 IMAGING — MG DIGITAL SCREENING BILAT W/ TOMO W/ CAD
8 series · 8 of 24 positions shown · non-contrast
Comparison: None.

CLINICAL DATA: Screening.

EXAM:
DIGITAL SCREENING BILATERAL MAMMOGRAM WITH TOMO AND CAD

[L CC synth-2D]
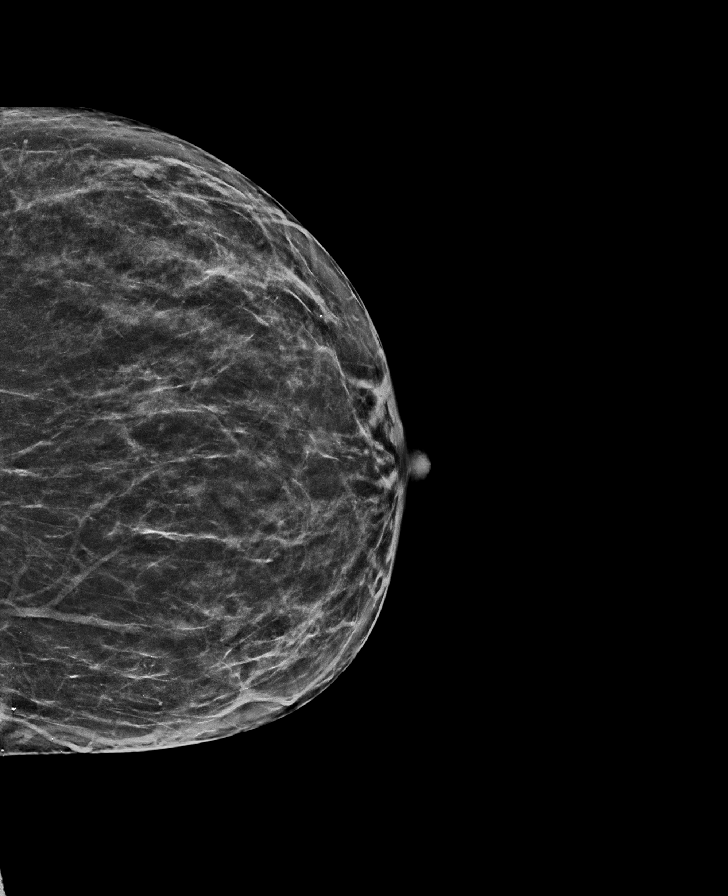

[L MLO synth-2D]
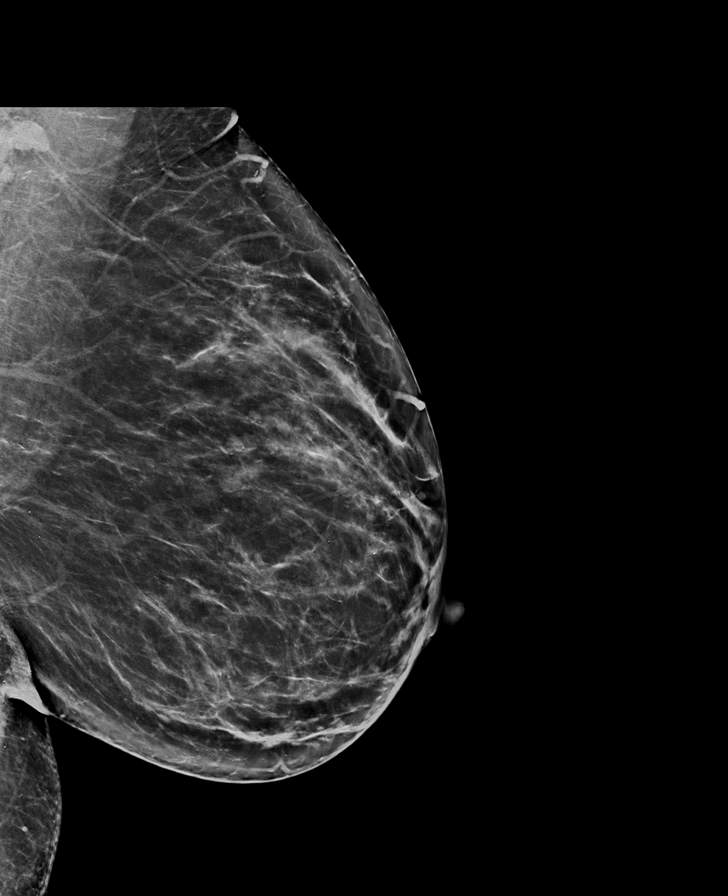

[R MLO synth-2D]
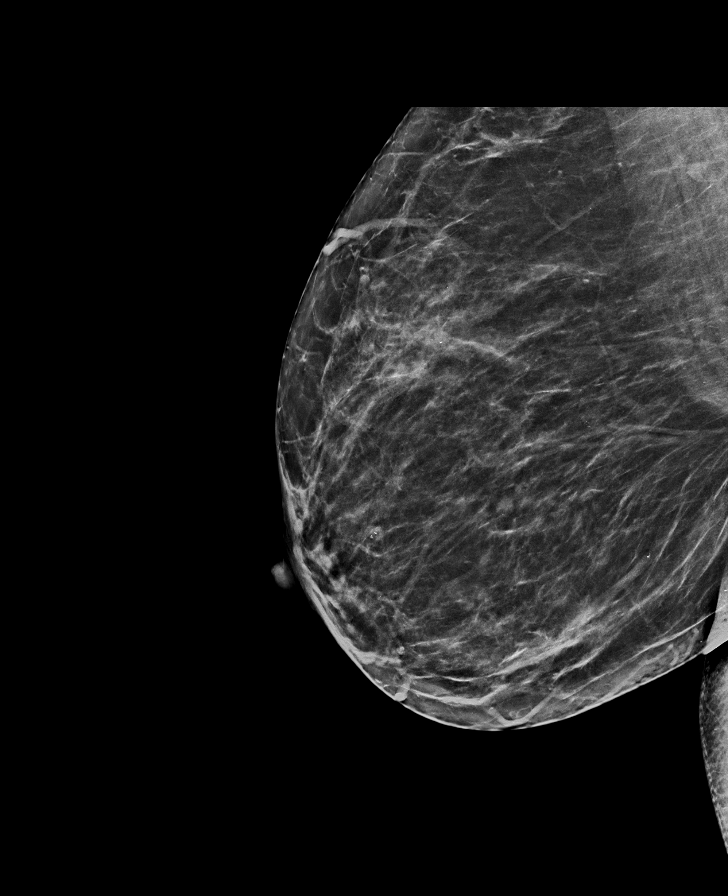

[R CC synth-2D]
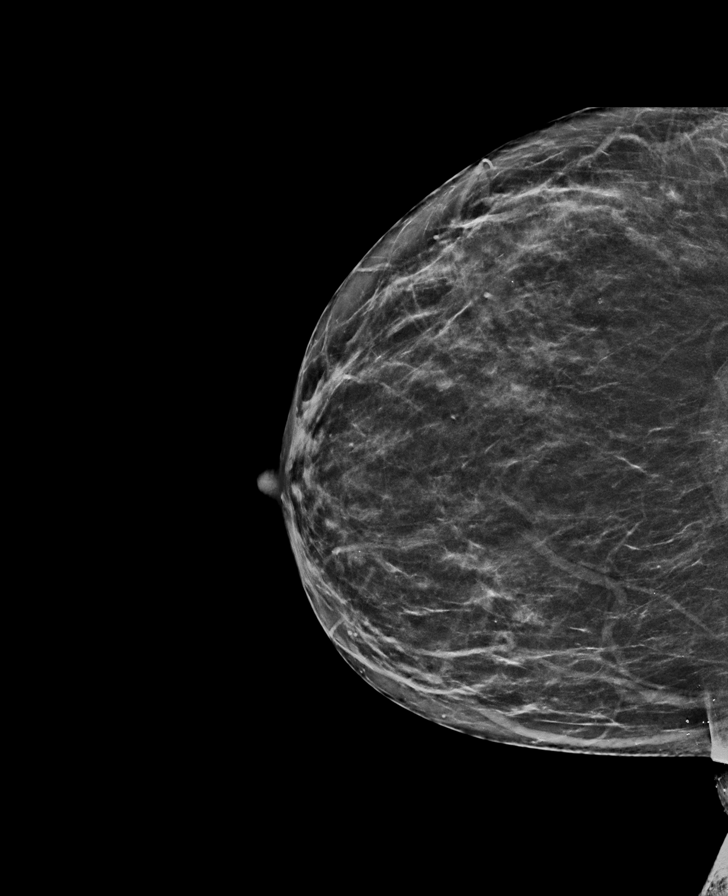

[L MLO tomo · tomo slice 38/75.0]
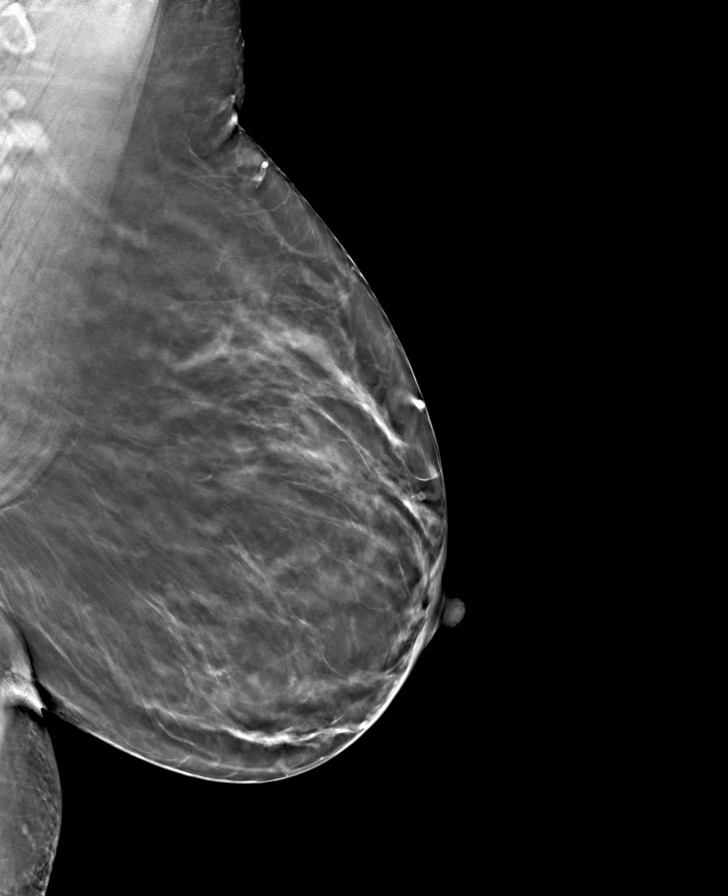

[R CC tomo · tomo slice 33/66.0]
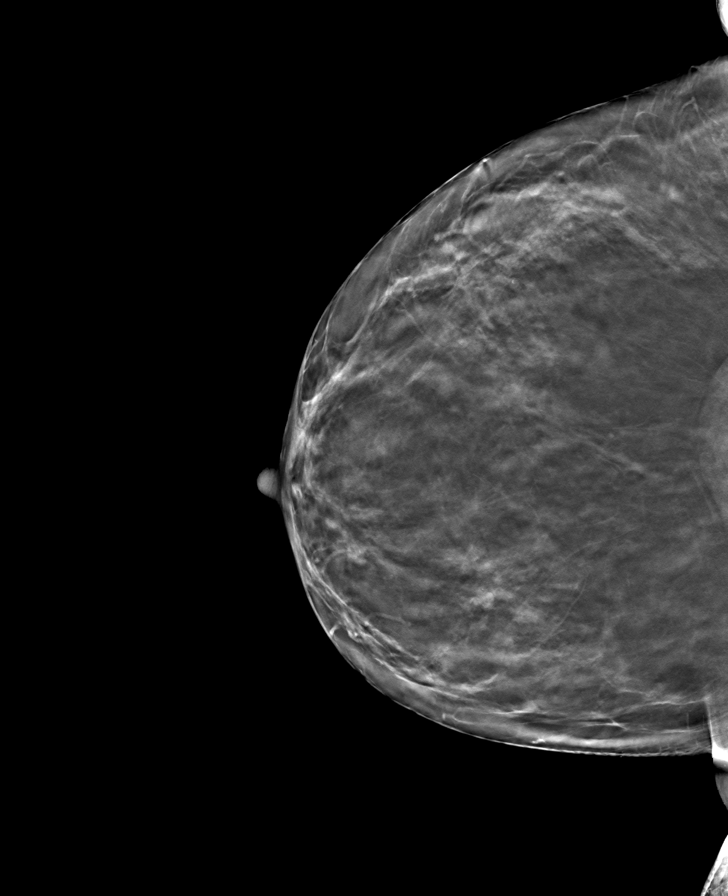

[L CC tomo · tomo slice 33/65.0]
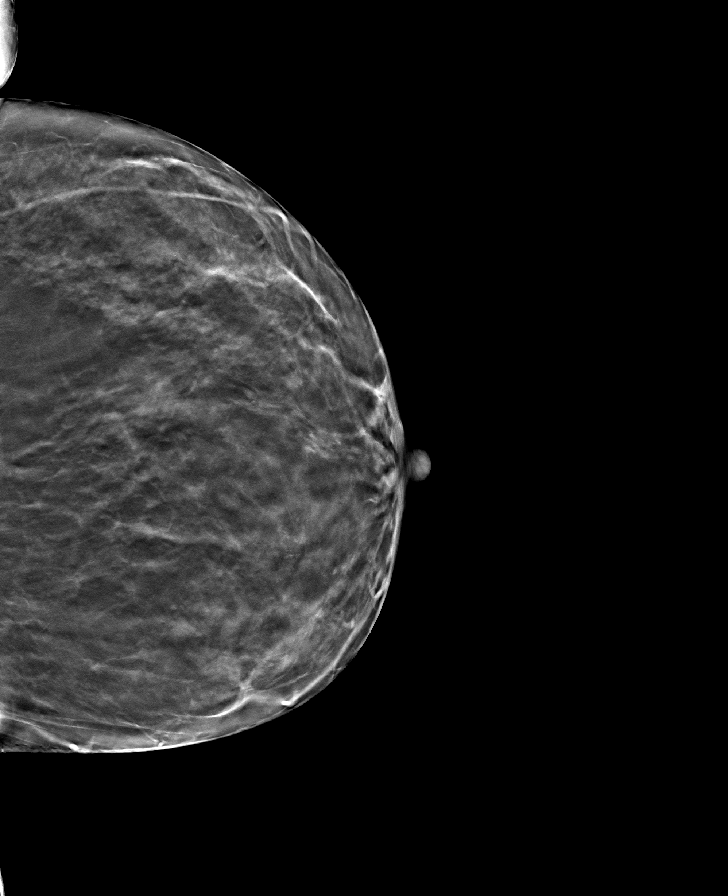

[R MLO tomo · tomo slice 35/70.0]
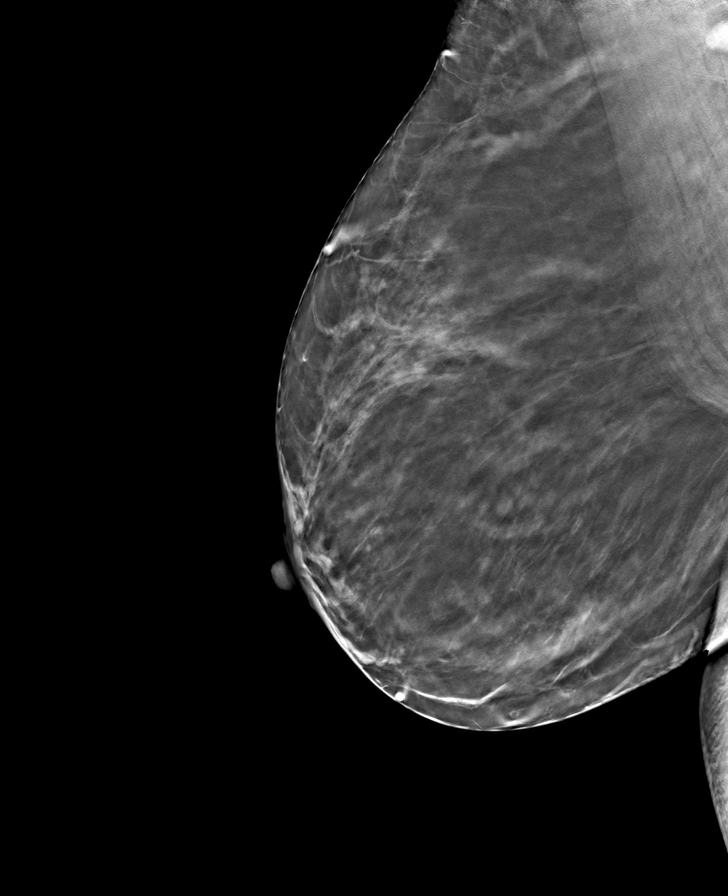

[8 of 24 positions shown; findings below may reference images not displayed]

ACR Breast Density Category b: There are scattered areas of
fibroglandular density.
FINDINGS: In the right breast mass with calcifications and mass require
further evaluation.

In the left breast masses require further evaluation.

Images were processed with CAD.
IMPRESSION: Further evaluation is suggested for possible mass with
calcifications and mass in the right breast.

Further evaluation is suggested for possible masses in the left
breast.

RECOMMENDATION:
Diagnostic mammogram and possibly ultrasound of both breasts.
(Code:[id number])

The patient will be contacted regarding the findings, and additional
imaging will be scheduled.

BI-RADS CATEGORY  0: Incomplete. Need additional imaging evaluation
and/or prior mammograms for comparison.

## 2022-05-12 ENCOUNTER — Encounter: Payer: Self-pay | Admitting: Emergency Medicine

## 2022-05-12 ENCOUNTER — Ambulatory Visit
Admission: EM | Admit: 2022-05-12 | Discharge: 2022-05-12 | Disposition: A | Discharge status: Home / Self Care | Payer: BC Managed Care – PPO | Attending: Nurse Practitioner | Admitting: Nurse Practitioner

## 2022-05-12 DIAGNOSIS — H6992 Unspecified Eustachian tube disorder, left ear: Secondary | ICD-10-CM | POA: Diagnosis not present

## 2022-05-12 MED ORDER — FLUTICASONE PROPIONATE 50 MCG/ACT NA SUSP: 2.0000 | Freq: Every day | NASAL | 0 refills | Status: DC

## 2022-05-12 MED ORDER — PREDNISONE 20 MG PO TABS: 20.0000 mg | ORAL_TABLET | Freq: Every day | ORAL | 0 refills | Status: AC

## 2022-05-12 MED ORDER — CETIRIZINE-PSEUDOEPHEDRINE ER 5-120 MG PO TB12: 1.0000 | ORAL_TABLET | Freq: Every day | ORAL | 0 refills | Status: DC

## 2022-05-12 NOTE — ED Provider Notes (Signed)
[facility]    CSN: [id number] Arrival date & time: 05/12/22  1514      History   Chief Complaint No chief complaint on file.   HPI Rachel Jefferson is a 41 y.o. female.   The history is provided by the patient.   Patient presents with a 2-week history of left ear pain, fullness, and pressure.  Patient states that the hearing in the left ear also sounds muffled.  She denies fever, chills, headache, ear discharge or drainage, cough, nasal congestion, runny nose, or cerumen impaction.  Patient denies previous history of seasonal allergies.  Patient states she has been taking over-the-counter medications such as ibuprofen for her ear with minimal relief.  Past Medical History:  Diagnosis Date   Anxiety    Asthma    Bronchitis    Dyspnea    GERD (gastroesophageal reflux disease)    Insomnia    Trichimoniasis     Patient Active Problem List   Diagnosis Date Noted   Anxiety and depression 12/15/2021   Encounter for screening fecal occult blood testing 12/06/2020   Encounter for gynecological examination with Papanicolaou smear of cervix 12/06/2020   Screening for colorectal cancer 11/09/2019   Encounter for well woman exam with routine gynecological exam 11/09/2019   Vomiting 12/01/2018   GERD (gastroesophageal reflux disease) 09/02/2018   Dysphagia 09/02/2018   S/P abdominal supracervical subtotal hysterectomy 02/09/2018    Past Surgical History:  Procedure Laterality Date   ABDOMINAL HYSTERECTOMY     BILATERAL SALPINGECTOMY Bilateral 02/09/2018   Procedure: BILATERAL SALPINGECTOMY;  Surgeon: [name], MD;  Location: [facility];  Service: Gynecology;  Laterality: Bilateral;   ESOPHAGOGASTRODUODENOSCOPY (EGD) WITH PROPOFOL N/A 10/03/2018   Procedure: ESOPHAGOGASTRODUODENOSCOPY (EGD) WITH PROPOFOL;  Surgeon: Daneil Dolin, MD;  Location: [facility];  Service: Endoscopy;  Laterality: N/A;  1:45pm   MALONEY DILATION N/A 10/03/2018   Procedure: Venia Minks  DILATION;  Surgeon: Daneil Dolin, MD;  Location: [facility];  Service: Endoscopy;  Laterality: N/A;   SUPRACERVICAL ABDOMINAL HYSTERECTOMY N/A 02/09/2018   Procedure: HYSTERECTOMY SUPRACERVICAL ABDOMINAL;  Surgeon: [name], MD;  Location: [facility];  Service: Gynecology;  Laterality: N/A;   TUBAL LIGATION     tubes tied      OB History     Gravida  5   Para  3   Term      Preterm      AB  2   Living  3      SAB      IAB  2   Ectopic      Multiple      Live Births               Home Medications    Prior to Admission medications   Medication Sig Start Date End Date Taking? Authorizing Provider  cetirizine-pseudoephedrine (ZYRTEC-D) 5-120 MG tablet Take 1 tablet by mouth daily. 05/12/22  Yes Hunter Pinkard-Warren, Alda Lea, NP  fluticasone (FLONASE) 50 MCG/ACT nasal spray Place 2 sprays into both nostrils daily. 05/12/22  Yes [place] Spees-Warren, Alda Lea, NP  predniSONE (DELTASONE) 20 MG tablet Take 1 tablet (20 mg total) by mouth daily with breakfast for 5 days. 05/12/22 05/17/22 Yes Kizzie Cotten-Warren, Alda Lea, NP  albuterol (PROVENTIL) (2.5 MG/3ML) 0.083% nebulizer solution Take 3 mLs (2.5 mg total) by nebulization every 6 (six) hours as needed for wheezing or shortness of breath. 01/08/21   Faustino Congress, NP  cetirizine (ZYRTEC) 10 MG tablet Take  1 tablet (10 mg total) by mouth daily. Patient taking differently: Take 10 mg by mouth as needed. 02/25/20   Wurst, [place], PA-C  lidocaine (XYLOCAINE) 2 % solution Use as directed 10 mLs in the mouth or throat every 8 (eight) hours as needed for mouth pain. 01/10/22   Desree Leap-Warren, Alda Lea, NP  montelukast (SINGULAIR) 10 MG tablet Take 10 mg by mouth daily. 10/31/21   [provider]  Multiple Vitamin (MULTIVITAMIN) tablet Take 1 tablet by mouth daily. Gummy    [provider]  Multiple Vitamins-Minerals (HAIR SKIN AND NAILS FORMULA PO) Take 2 tablets by mouth daily. Gummies    [provider]   ondansetron (ZOFRAN) 4 MG tablet Take 1 tablet (4 mg total) by mouth every 8 (eight) hours as needed for nausea or vomiting. 03/16/22   Azucena Cecil, PA-C  promethazine-dextromethorphan (PROMETHAZINE-DM) 6.25-15 MG/5ML syrup Take 5 mLs by mouth 4 (four) times daily as needed for cough. 01/10/22   Youcef Klas-Warren, Alda Lea, NP  sertraline (ZOLOFT) 50 MG tablet Take 50 mg by mouth daily. 10/02/21   [provider]  VENTOLIN HFA 108 (90 Base) MCG/ACT inhaler Inhale 2 puffs into the lungs every 4 (four) hours as needed for wheezing or shortness of breath. 01/08/21   Faustino Congress, NP    Family History Family History  Problem Relation Age of Onset   Heart attack Paternal Grandfather    Diabetes Paternal Grandmother    Hypertension Mother    Colon cancer Neg Hx    Gastric cancer Neg Hx    Esophageal cancer Neg Hx     Social History Social History   Tobacco Use   Smoking status: Never   Smokeless tobacco: Never  Vaping Use   Vaping Use: Never used  Substance Use Topics   Alcohol use: Yes    Comment: occasionally   Drug use: No     Allergies   Penicillins   Review of Systems Review of Systems Per HPI  Physical Exam Triage Vital Signs ED Triage Vitals  Enc Vitals Group     BP 05/12/22 1534 127/81     Pulse Rate 05/12/22 1534 89     Resp 05/12/22 1534 16     Temp 05/12/22 1534 99.1 F (37.3 C)     Temp Source 05/12/22 1534 Oral     SpO2 05/12/22 1534 98 %     Weight --      Height --      Head Circumference --      Peak Flow --      Pain Score 05/12/22 1535 8     Pain Loc --      Pain Edu? --      Excl. in [place]? --    No data found.  Updated Vital Signs BP 127/81 (BP Location: Right Arm)   Pulse 89   Temp 99.1 F (37.3 C) (Oral)   Resp 16   LMP 01/15/2018 Comment: [place]  SpO2 98%   Visual Acuity Right Eye Distance:   Left Eye Distance:   Bilateral Distance:    Right Eye Near:   Left Eye Near:    Bilateral Near:     Physical  Exam Vitals and nursing note reviewed.  Constitutional:      General: She is not in acute distress.    Appearance: Normal appearance.  HENT:     Head: Normocephalic.     Right Ear: Tympanic membrane, ear canal and external ear normal.  Left Ear: Ear canal and external ear normal. A middle ear effusion is present.     Nose: Nose normal.     Mouth/Throat:     Mouth: Mucous membranes are moist.  Eyes:     Extraocular Movements: Extraocular movements intact.     Conjunctiva/sclera: Conjunctivae normal.     Pupils: Pupils are equal, round, and reactive to light.  Cardiovascular:     Rate and Rhythm: Normal rate.     Heart sounds: Normal heart sounds.  Pulmonary:     Effort: Pulmonary effort is normal.     Breath sounds: Normal breath sounds.  Abdominal:     General: Bowel sounds are normal.     Palpations: Abdomen is soft.  Musculoskeletal:     Cervical back: Normal range of motion.  Lymphadenopathy:     Cervical: No cervical adenopathy.  Skin:    General: Skin is warm and dry.  Neurological:     General: No focal deficit present.     Mental Status: She is alert and oriented to person, place, and time.  Psychiatric:        Mood and Affect: Mood normal.        Behavior: Behavior normal.      UC Treatments / Results  Labs (all labs ordered are listed, but only abnormal results are displayed) Labs Reviewed - No data to display  EKG   Radiology No results found.  Procedures Procedures (including critical care time)  Medications Ordered in UC Medications - No data to display  Initial Impression / Assessment and Plan / UC Course  I have reviewed the triage vital signs and the nursing notes.  Pertinent labs & imaging results that were available during my care of the patient were reviewed by me and considered in my medical decision making (see chart for details).  Patient presents for complaints of left ear pain that is been present for the last 2 weeks.  On exam,  her vital signs are stable, she is in no acute distress.  There is no erythema of the left tympanic membrane consistent with otitis media, she does have a left middle ear effusion however.  Symptoms are consistent with left eustachian tube dysfunction.  We will start patient on fluticasone and Zyrtec-D for her symptoms.  Patient was advised to try this medication for the next 5 to 7 days, if symptoms do not improve, patient has also been sent a prescription for prednisone 20 mg for 5 days.  Patient was advised that if symptoms fail to improve after this treatment regimen, she will need to follow-up with her primary care physician or with ear nose and throat for further evaluation.  Supportive care recommendations were provided to the patient.  Patient verbalizes understanding.  All questions were answered. Final Clinical Impressions(s) / UC Diagnoses   Final diagnoses:  Acute dysfunction of left eustachian tube     Discharge Instructions      Take medication as prescribed. May take Tylenol or ibuprofen as needed for pain.  As discussed, do not take ibuprofen when you take prednisone if it is needed. Warm compresses to the affected ear help with comfort. Do not stick anything inside the ear while symptoms persist. Avoid getting water inside of the ear while symptoms persist. If your symptoms do not improve after this treatment regimen, as discussed, you will need to follow-up with your primary care physician or with ear nose and throat. Follow-up as needed.     ED Prescriptions  Medication Sig Dispense Auth. Provider   fluticasone (FLONASE) 50 MCG/ACT nasal spray Place 2 sprays into both nostrils daily. 16 g Jacere Pangborn-Warren, Alda Lea, NP   cetirizine-pseudoephedrine (ZYRTEC-D) 5-120 MG tablet Take 1 tablet by mouth daily. 30 tablet Praveen Coia-Warren, Alda Lea, NP   predniSONE (DELTASONE) 20 MG tablet Take 1 tablet (20 mg total) by mouth daily with breakfast for 5 days. 5 tablet Ulyess Muto-Warren,  Alda Lea, NP      PDMP not reviewed this encounter.   Tish Men, NP 05/12/22 [id number]

## 2022-05-12 NOTE — Discharge Instructions (Addendum)
Take medication as prescribed. May take Tylenol or ibuprofen as needed for pain.  As discussed, do not take ibuprofen when you take prednisone if it is needed. Warm compresses to the affected ear help with comfort. Do not stick anything inside the ear while symptoms persist. Avoid getting water inside of the ear while symptoms persist. If your symptoms do not improve after this treatment regimen, as discussed, you will need to follow-up with your primary care physician or with ear nose and throat. Follow-up as needed.

## 2022-05-12 NOTE — ED Triage Notes (Signed)
Left ear pain x 2 weeks 

## 2022-08-10 ENCOUNTER — Ambulatory Visit
Admission: EM | Admit: 2022-08-10 | Discharge: 2022-08-10 | Disposition: A | Discharge status: Home / Self Care | Payer: BC Managed Care – PPO | Attending: Family Medicine | Admitting: Family Medicine

## 2022-08-10 DIAGNOSIS — J069 Acute upper respiratory infection, unspecified: Secondary | ICD-10-CM | POA: Diagnosis not present

## 2022-08-10 DIAGNOSIS — J4521 Mild intermittent asthma with (acute) exacerbation: Secondary | ICD-10-CM | POA: Diagnosis not present

## 2022-08-10 DIAGNOSIS — Z7951 Long term (current) use of inhaled steroids: Secondary | ICD-10-CM | POA: Diagnosis not present

## 2022-08-10 DIAGNOSIS — K219 Gastro-esophageal reflux disease without esophagitis: Secondary | ICD-10-CM | POA: Diagnosis not present

## 2022-08-10 DIAGNOSIS — F32A Depression, unspecified: Secondary | ICD-10-CM | POA: Insufficient documentation

## 2022-08-10 DIAGNOSIS — Z79899 Other long term (current) drug therapy: Secondary | ICD-10-CM | POA: Diagnosis not present

## 2022-08-10 DIAGNOSIS — R058 Other specified cough: Secondary | ICD-10-CM | POA: Insufficient documentation

## 2022-08-10 DIAGNOSIS — Z1152 Encounter for screening for COVID-19: Secondary | ICD-10-CM | POA: Insufficient documentation

## 2022-08-10 DIAGNOSIS — F419 Anxiety disorder, unspecified: Secondary | ICD-10-CM | POA: Insufficient documentation

## 2022-08-10 MED ORDER — PROMETHAZINE-DM 6.25-15 MG/5ML PO SYRP: 5.0000 mL | ORAL_SOLUTION | Freq: Four times a day (QID) | ORAL | 0 refills | Status: DC | PRN

## 2022-08-10 MED ORDER — FLUTICASONE PROPIONATE 50 MCG/ACT NA SUSP: 1.0000 | Freq: Two times a day (BID) | NASAL | 2 refills | Status: AC

## 2022-08-10 MED ORDER — PREDNISONE 50 MG PO TABS: ORAL_TABLET | ORAL | 0 refills | Status: DC

## 2022-08-10 NOTE — ED Provider Notes (Signed)
[facility]    CSN: [id number] Arrival date & time: 08/10/22  0919      History   Chief Complaint No chief complaint on file.   HPI Rachel Jefferson is a 42 y.o. female.   Patient presenting today with 4-day history of headache, sore throat, congestion, cough, ear pressure, wheezing.  Denies fever, body aches, chest pain, shortness of breath, abdominal pain, nausea vomiting or diarrhea.  Taking NyQuil, Tylenol and albuterol with temporary relief of symptoms.  Does have a history of asthma well-controlled on albuterol as needed.  No known sick contacts recently.    Past Medical History:  Diagnosis Date   Anxiety    Asthma    Bronchitis    Dyspnea    GERD (gastroesophageal reflux disease)    Insomnia    Trichimoniasis     Patient Active Problem List   Diagnosis Date Noted   Anxiety and depression 12/15/2021   Encounter for screening fecal occult blood testing 12/06/2020   Encounter for gynecological examination with Papanicolaou smear of cervix 12/06/2020   Screening for colorectal cancer 11/09/2019   Encounter for well woman exam with routine gynecological exam 11/09/2019   Vomiting 12/01/2018   GERD (gastroesophageal reflux disease) 09/02/2018   Dysphagia 09/02/2018   S/P abdominal supracervical subtotal hysterectomy 02/09/2018    Past Surgical History:  Procedure Laterality Date   ABDOMINAL HYSTERECTOMY     BILATERAL SALPINGECTOMY Bilateral 02/09/2018   Procedure: BILATERAL SALPINGECTOMY;  Surgeon: [name], MD;  Location: [facility];  Service: Gynecology;  Laterality: Bilateral;   ESOPHAGOGASTRODUODENOSCOPY (EGD) WITH PROPOFOL N/A 10/03/2018   Procedure: ESOPHAGOGASTRODUODENOSCOPY (EGD) WITH PROPOFOL;  Surgeon: Daneil Dolin, MD;  Location: [facility];  Service: Endoscopy;  Laterality: N/A;  1:45pm   MALONEY DILATION N/A 10/03/2018   Procedure: Venia Minks DILATION;  Surgeon: Daneil Dolin, MD;  Location: [facility];  Service: Endoscopy;   Laterality: N/A;   SUPRACERVICAL ABDOMINAL HYSTERECTOMY N/A 02/09/2018   Procedure: HYSTERECTOMY SUPRACERVICAL ABDOMINAL;  Surgeon: [name], MD;  Location: [facility];  Service: Gynecology;  Laterality: N/A;   TUBAL LIGATION     tubes tied      OB History     Gravida  5   Para  3   Term      Preterm      AB  2   Living  3      SAB      IAB  2   Ectopic      Multiple      Live Births               Home Medications    Prior to Admission medications   Medication Sig Start Date End Date Taking? Authorizing Provider  fluticasone (FLONASE) 50 MCG/ACT nasal spray Place 1 spray into both nostrils 2 (two) times daily. 08/10/22  Yes Volney American, PA-C  predniSONE (DELTASONE) 50 MG tablet Take 1 tab daily with breakfast for 3 days 08/10/22  Yes Volney American, PA-C  promethazine-dextromethorphan (PROMETHAZINE-DM) 6.25-15 MG/5ML syrup Take 5 mLs by mouth 4 (four) times daily as needed. 08/10/22  Yes Volney American, PA-C  albuterol (PROVENTIL) (2.5 MG/3ML) 0.083% nebulizer solution Take 3 mLs (2.5 mg total) by nebulization every 6 (six) hours as needed for wheezing or shortness of breath. 01/08/21   Faustino Congress, NP  cetirizine (ZYRTEC) 10 MG tablet Take 1 tablet (10 mg total) by mouth daily. Patient taking differently: Take 10 mg  by mouth as needed. 02/25/20   Wurst, [place], PA-C  cetirizine-pseudoephedrine (ZYRTEC-D) 5-120 MG tablet Take 1 tablet by mouth daily. 05/12/22   Leath-Warren, Alda Lea, NP  fluticasone (FLONASE) 50 MCG/ACT nasal spray Place 2 sprays into both nostrils daily. 05/12/22   Leath-Warren, Alda Lea, NP  lidocaine (XYLOCAINE) 2 % solution Use as directed 10 mLs in the mouth or throat every 8 (eight) hours as needed for mouth pain. 01/10/22   Leath-Warren, Alda Lea, NP  montelukast (SINGULAIR) 10 MG tablet Take 10 mg by mouth daily. 10/31/21   [provider]  Multiple Vitamin (MULTIVITAMIN) tablet Take 1 tablet by  mouth daily. Gummy    [provider]  Multiple Vitamins-Minerals (HAIR SKIN AND NAILS FORMULA PO) Take 2 tablets by mouth daily. Gummies    [provider]  ondansetron (ZOFRAN) 4 MG tablet Take 1 tablet (4 mg total) by mouth every 8 (eight) hours as needed for nausea or vomiting. 03/16/22   Azucena Cecil, PA-C  promethazine-dextromethorphan (PROMETHAZINE-DM) 6.25-15 MG/5ML syrup Take 5 mLs by mouth 4 (four) times daily as needed for cough. 01/10/22   Leath-Warren, Alda Lea, NP  sertraline (ZOLOFT) 50 MG tablet Take 50 mg by mouth daily. 10/02/21   [provider]  VENTOLIN HFA 108 (90 Base) MCG/ACT inhaler Inhale 2 puffs into the lungs every 4 (four) hours as needed for wheezing or shortness of breath. 01/08/21   Faustino Congress, NP    Family History Family History  Problem Relation Age of Onset   Heart attack Paternal Grandfather    Diabetes Paternal Grandmother    Hypertension Mother    Colon cancer Neg Hx    Gastric cancer Neg Hx    Esophageal cancer Neg Hx     Social History Social History   Tobacco Use   Smoking status: Never   Smokeless tobacco: Never  Vaping Use   Vaping Use: Never used  Substance Use Topics   Alcohol use: Yes    Comment: occasionally   Drug use: No     Allergies   Penicillins   Review of Systems Review of Systems Per HPI  Physical Exam Triage Vital Signs ED Triage Vitals  Enc Vitals Group     BP 08/10/22 0924 126/88     Pulse Rate 08/10/22 0924 90     Resp 08/10/22 0924 20     Temp 08/10/22 0924 98.6 F (37 C)     Temp Source 08/10/22 0924 Oral     SpO2 08/10/22 0924 96 %     Weight --      Height --      Head Circumference --      Peak Flow --      Pain Score 08/10/22 0927 0     Pain Loc --      Pain Edu? --      Excl. in [place]? --    No data found.  Updated Vital Signs BP 126/88 (BP Location: Right Arm)   Pulse 90   Temp 98.6 F (37 C) (Oral)   Resp 20   LMP 01/15/2018 Comment: [place]  SpO2  96%   Visual Acuity Right Eye Distance:   Left Eye Distance:   Bilateral Distance:    Right Eye Near:   Left Eye Near:    Bilateral Near:     Physical Exam Vitals and nursing note reviewed.  Constitutional:      Appearance: Normal appearance.  HENT:     Head: Atraumatic.  Right Ear: Tympanic membrane and external ear normal.     Left Ear: Tympanic membrane and external ear normal.     Nose: Rhinorrhea present.     Mouth/Throat:     Mouth: Mucous membranes are moist.     Pharynx: Posterior oropharyngeal erythema present.  Eyes:     Extraocular Movements: Extraocular movements intact.     Conjunctiva/sclera: Conjunctivae normal.  Cardiovascular:     Rate and Rhythm: Normal rate and regular rhythm.     Heart sounds: Normal heart sounds.  Pulmonary:     Effort: Pulmonary effort is normal.     Breath sounds: Normal breath sounds. No wheezing or rales.  Musculoskeletal:        General: Normal range of motion.     Cervical back: Normal range of motion and neck supple.  Skin:    General: Skin is warm and dry.  Neurological:     Mental Status: She is alert and oriented to person, place, and time.  Psychiatric:        Mood and Affect: Mood normal.        Thought Content: Thought content normal.      UC Treatments / Results  Labs (all labs ordered are listed, but only abnormal results are displayed) Labs Reviewed  SARS CORONAVIRUS 2 (TAT 6-24 HRS)    EKG   Radiology No results found.  Procedures Procedures (including critical care time)  Medications Ordered in UC Medications - No data to display  Initial Impression / Assessment and Plan / UC Course  I have reviewed the triage vital signs and the nursing notes.  Pertinent labs & imaging results that were available during my care of the patient were reviewed by me and considered in my medical decision making (see chart for details).     Vital signs and exam overall reassuring, suggestive of a viral upper  respiratory infection.  COVID testing pending, treat with short course of prednisone for asthma exacerbation secondary to viral respiratory infection as well as Phenergan DM, Flonase, supportive over-the-counter medications and home care.  Return for worsening symptoms.  Final Clinical Impressions(s) / UC Diagnoses   Final diagnoses:  Viral URI with cough  Mild intermittent asthma with acute exacerbation   Discharge Instructions   None    ED Prescriptions     Medication Sig Dispense Auth. Provider   predniSONE (DELTASONE) 50 MG tablet Take 1 tab daily with breakfast for 3 days 3 tablet Volney American, PA-C   promethazine-dextromethorphan (PROMETHAZINE-DM) 6.25-15 MG/5ML syrup Take 5 mLs by mouth 4 (four) times daily as needed. 100 mL Volney American, PA-C   fluticasone [facility]) 50 MCG/ACT nasal spray Place 1 spray into both nostrils 2 (two) times daily. 16 g Volney American, [place]      PDMP not reviewed this encounter.   Volney American, [place] 08/10/22 1024

## 2022-08-10 NOTE — ED Triage Notes (Signed)
Pt reports headaches, throat pain, left ear ache, coughing, and wheezing x 4 days. Took nyquil and tylenol but only slight relief.

## 2022-08-11 LAB — SARS CORONAVIRUS 2 (TAT 6-24 HRS): SARS Coronavirus 2: NEGATIVE

## 2022-08-25 ENCOUNTER — Other Ambulatory Visit: Payer: Self-pay | Admitting: Family Medicine

## 2022-08-25 NOTE — Telephone Encounter (Signed)
Urgent Care Patient Requested Prescriptions  Pending Prescriptions Disp Refills   promethazine-dextromethorphan (PROMETHAZINE-DM) 6.25-15 MG/5ML syrup [Pharmacy Med Name: PROMETHAZINE DM SYRUP] 100 mL 0    Sig: TAKE 5 ML BY MOUTH FOUR TIMES DAILY AS NEEDED     There is no refill protocol information for this order

## 2022-11-24 ENCOUNTER — Encounter (HOSPITAL_COMMUNITY): Payer: Self-pay

## 2022-11-24 ENCOUNTER — Other Ambulatory Visit (HOSPITAL_COMMUNITY): Payer: Self-pay | Admitting: Adult Health

## 2022-11-24 ENCOUNTER — Other Ambulatory Visit (HOSPITAL_COMMUNITY): Payer: Self-pay | Admitting: Family Medicine

## 2022-11-24 DIAGNOSIS — R928 Other abnormal and inconclusive findings on diagnostic imaging of breast: Secondary | ICD-10-CM

## 2022-11-30 ENCOUNTER — Encounter (HOSPITAL_COMMUNITY): Payer: BC Managed Care – PPO

## 2022-11-30 DIAGNOSIS — Z1231 Encounter for screening mammogram for malignant neoplasm of breast: Secondary | ICD-10-CM

## 2022-12-22 ENCOUNTER — Ambulatory Visit (HOSPITAL_COMMUNITY)
Admission: RE | Admit: 2022-12-22 | Discharge: 2022-12-22 | Disposition: A | Discharge status: Home / Self Care | Payer: BC Managed Care – PPO | Source: Ambulatory Visit | Attending: Adult Health | Admitting: Adult Health

## 2022-12-22 ENCOUNTER — Encounter (HOSPITAL_COMMUNITY): Payer: Self-pay

## 2022-12-22 DIAGNOSIS — R928 Other abnormal and inconclusive findings on diagnostic imaging of breast: Secondary | ICD-10-CM

## 2022-12-22 DIAGNOSIS — R92323 Mammographic fibroglandular density, bilateral breasts: Secondary | ICD-10-CM | POA: Diagnosis not present

## 2023-03-06 DIAGNOSIS — R519 Headache, unspecified: Secondary | ICD-10-CM | POA: Diagnosis not present

## 2023-03-06 DIAGNOSIS — J309 Allergic rhinitis, unspecified: Secondary | ICD-10-CM | POA: Diagnosis not present

## 2023-03-06 DIAGNOSIS — Z6838 Body mass index (BMI) 38.0-38.9, adult: Secondary | ICD-10-CM | POA: Diagnosis not present

## 2023-03-06 DIAGNOSIS — H60502 Unspecified acute noninfective otitis externa, left ear: Secondary | ICD-10-CM | POA: Diagnosis not present

## 2023-06-03 ENCOUNTER — Ambulatory Visit: Payer: BC Managed Care – PPO | Admitting: Adult Health

## 2023-06-03 ENCOUNTER — Encounter: Payer: Self-pay | Admitting: Adult Health

## 2023-06-03 VITALS — BP 116/73 | HR 84 | Ht 64.0 in | Wt 225.0 lb

## 2023-06-03 DIAGNOSIS — Z1339 Encounter for screening examination for other mental health and behavioral disorders: Secondary | ICD-10-CM

## 2023-06-03 DIAGNOSIS — Z01419 Encounter for gynecological examination (general) (routine) without abnormal findings: Secondary | ICD-10-CM | POA: Diagnosis not present

## 2023-06-03 DIAGNOSIS — Z90711 Acquired absence of uterus with remaining cervical stump: Secondary | ICD-10-CM | POA: Diagnosis not present

## 2023-06-03 DIAGNOSIS — Z1211 Encounter for screening for malignant neoplasm of colon: Secondary | ICD-10-CM | POA: Diagnosis not present

## 2023-06-03 LAB — HEMOCCULT GUIAC POC 1CARD (OFFICE): Fecal Occult Blood, POC: NEGATIVE

## 2023-06-03 NOTE — Progress Notes (Signed)
Patient ID: Rachel Jefferson, female   DOB: 1980/12/06, 42 y.o.   MRN: [id number] History of Present Illness: Rachel Jefferson is a 42 year old black female, single, sp [facility], in for a well woman gyn exam.     Component Value Date/Time   DIAGPAP  12/06/2020 1223    - Negative for intraepithelial lesion or malignancy (NILM)   DIAGPAP  11/09/2017 0000    NEGATIVE FOR INTRAEPITHELIAL LESIONS OR MALIGNANCY.   HPVHIGH Negative 12/06/2020 1223   ADEQPAP Satisfactory for evaluation. 12/06/2020 1223   ADEQPAP  11/09/2017 0000    Satisfactory for evaluation  endocervical/transformation zone component PRESENT.   PCP is Terie Purser PA.   Current Medications, Allergies, Past Medical History, Past Surgical History, Family History and Social History were reviewed in [employer] record.     Review of Systems: Patient denies any headaches, hearing loss, fatigue, blurred vision, shortness of breath, chest pain, abdominal pain, problems with bowel movements, urination, or intercourse. No joint pain or mood swings.  Has occasional pain above left breast   Physical Exam:BP 116/73 (BP Location: Left Arm, Patient Position: Sitting, Cuff Size: Normal)   Pulse 84   Ht 5\' 4"  (1.626 m)   Wt 225 lb (102.1 kg)   LMP 01/15/2018 Comment: [facility]  BMI 38.62 kg/m   General:  Well developed, well nourished, no acute distress Skin:  Warm and dry Neck:  Midline trachea, normal thyroid, good ROM, no lymphadenopathy Lungs; Clear to auscultation bilaterally Breast:  No dominant palpable mass, retraction, or nipple discharge Cardiovascular: Regular rate and rhythm Abdomen:  Soft, non tender, no hepatosplenomegaly Pelvic:  External genitalia is normal in appearance, no lesions.  The vagina is normal in appearance. Urethra has no lesions or masses. The cervix is smooth.  Uterus is absent.  No adnexal masses or tenderness noted.Bladder is non tender, no masses felt. Rectal: Good sphincter tone, no  polyps, or hemorrhoids felt.  Hemoccult negative. Extremities/musculoskeletal:  No swelling or varicosities noted, no clubbing or cyanosis Psych:  No mood changes, alert and cooperative,seems happy AA is 3 Fall risk is low    06/03/2023    3:43 PM 12/15/2021    3:40 PM 12/06/2020   12:23 PM  Depression screen PHQ 2/9  Decreased Interest 1 2 0  Down, Depressed, Hopeless 1 1 0  PHQ - 2 Score 2 3 0  Altered sleeping 1 2 1   Tired, decreased energy 1 2 2   Change in appetite 1 2 0  Feeling bad or failure about yourself  0 0 0  Trouble concentrating 0 1 0  Moving slowly or fidgety/restless 0 0 0  Suicidal thoughts 0 0 0  PHQ-9 Score 5 10 3        06/03/2023    3:43 PM 12/15/2021    3:40 PM 12/06/2020   12:23 PM 11/09/2019   10:46 AM  GAD 7 : Generalized Anxiety Score  Nervous, Anxious, on Edge 0 1 0 0  Control/stop worrying 1 1 1  0  Worry too much - different things 1 1 1  0  Trouble relaxing 0 1 1 0  Restless 0 0 0 1  Easily annoyed or irritable 1 1 1  0  Afraid - awful might happen 0 0 0 0  Total GAD 7 Score 3 5 4 1     Upstream - 06/03/23 1542       Pregnancy Intention Screening   Does the patient want to become pregnant in the next year? N/A  Does the patient's partner want to become pregnant in the next year? N/A    Would the patient like to discuss contraceptive options today? N/A      Contraception Wrap Up   Current Method Female Sterilization   [facility]   End Method Female Sterilization   [facility]   Contraception Counseling Provided No              Examination chaperoned by Malachy Mood LPN   Impression and Plan: 1. Encounter for well woman exam with routine gynecological exam Pap and physical in 1 year Labs with PCP Mammogram was negative 12/22/22   2. S/P abdominal supracervical subtotal hysterectomy   3. Encounter for screening fecal occult blood testing Hemoccult was negative

## 2023-06-14 ENCOUNTER — Other Ambulatory Visit: Payer: Self-pay

## 2023-06-14 ENCOUNTER — Encounter (HOSPITAL_COMMUNITY): Payer: Self-pay | Admitting: Emergency Medicine

## 2023-06-14 ENCOUNTER — Emergency Department (HOSPITAL_COMMUNITY)
Admission: EM | Admit: 2023-06-14 | Discharge: 2023-06-15 | Disposition: A | Discharge status: Home / Self Care | Payer: BC Managed Care – PPO | Attending: Emergency Medicine | Admitting: Emergency Medicine

## 2023-06-14 ENCOUNTER — Emergency Department (HOSPITAL_COMMUNITY): Payer: BC Managed Care – PPO

## 2023-06-14 DIAGNOSIS — Y92410 Unspecified street and highway as the place of occurrence of the external cause: Secondary | ICD-10-CM | POA: Insufficient documentation

## 2023-06-14 DIAGNOSIS — R519 Headache, unspecified: Secondary | ICD-10-CM | POA: Insufficient documentation

## 2023-06-14 DIAGNOSIS — J45909 Unspecified asthma, uncomplicated: Secondary | ICD-10-CM | POA: Diagnosis not present

## 2023-06-14 DIAGNOSIS — M542 Cervicalgia: Secondary | ICD-10-CM | POA: Diagnosis not present

## 2023-06-14 DIAGNOSIS — S0990XA Unspecified injury of head, initial encounter: Secondary | ICD-10-CM | POA: Diagnosis not present

## 2023-06-14 DIAGNOSIS — S199XXA Unspecified injury of neck, initial encounter: Secondary | ICD-10-CM | POA: Diagnosis not present

## 2023-06-14 DIAGNOSIS — M25511 Pain in right shoulder: Secondary | ICD-10-CM | POA: Diagnosis not present

## 2023-06-14 MED ORDER — NAPROXEN 250 MG PO TABS
500.0000 mg | ORAL_TABLET | Freq: Once | ORAL | Status: AC
  Administered 2023-06-14: 500 mg via ORAL
  Filled 2023-06-14: qty 2
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
## 2023-06-15 NOTE — Discharge Instructions (Addendum)
As discussed, your imaging is reassuring.  You can alternate between ibuprofen and Tylenol every 4 hours as needed for pain.  You can also try over-the-counter lidocaine patches.  With your PCP in the next week for reevaluation of your symptoms.  Return to the ED if her symptoms worsen in the interim.

## 2023-08-27 ENCOUNTER — Ambulatory Visit
Admission: EM | Admit: 2023-08-27 | Discharge: 2023-08-27 | Disposition: A | Discharge status: Home / Self Care | Payer: BC Managed Care – PPO | Attending: Family Medicine | Admitting: Family Medicine

## 2023-08-27 DIAGNOSIS — J4521 Mild intermittent asthma with (acute) exacerbation: Secondary | ICD-10-CM

## 2023-08-27 DIAGNOSIS — J069 Acute upper respiratory infection, unspecified: Secondary | ICD-10-CM | POA: Diagnosis not present

## 2023-08-27 MED ORDER — DEXAMETHASONE SODIUM PHOSPHATE 10 MG/ML IJ SOLN
10.0000 mg | Freq: Once | INTRAMUSCULAR | Status: AC
  Administered 2023-08-27: 10 mg via INTRAMUSCULAR

## 2023-08-27 MED ORDER — FLUTICASONE PROPIONATE 50 MCG/ACT NA SUSP: 1.0000 | Freq: Two times a day (BID) | NASAL | 2 refills | Status: AC

## 2023-08-27 MED ORDER — PROMETHAZINE-DM 6.25-15 MG/5ML PO SYRP
5.0000 mL | ORAL_SOLUTION | Freq: Four times a day (QID) | ORAL | 0 refills | Status: AC | PRN
Start: 2023-08-27 — End: ?

## 2023-08-27 NOTE — ED Provider Notes (Signed)
[facility]    CSN: [id number] Arrival date & time: 08/27/23  4098      History   Chief Complaint No chief complaint on file.   HPI Rachel Jefferson is a 43 y.o. female.   Presenting today with 3-day history of cough, congestion, left ear pain, wheezing, chest tightness.  Denies fever, chills, chest pain, shortness of breath, abdominal pain, nausea vomiting or diarrhea.  So far trying albuterol, Tylenol with minimal relief.  Multiple sick contacts recently.  History of asthma on albuterol as needed.    Past Medical History:  Diagnosis Date   Anxiety    Asthma    Bronchitis    Dyspnea    GERD (gastroesophageal reflux disease)    Insomnia    Trichimoniasis     Patient Active Problem List   Diagnosis Date Noted   Anxiety and depression 12/15/2021   Encounter for screening fecal occult blood testing 12/06/2020   Encounter for gynecological examination with Papanicolaou smear of cervix 12/06/2020   Screening for colorectal cancer 11/09/2019   Encounter for well woman exam with routine gynecological exam 11/09/2019   Vomiting 12/01/2018   GERD (gastroesophageal reflux disease) 09/02/2018   Dysphagia 09/02/2018   S/P abdominal supracervical subtotal hysterectomy 02/09/2018    Past Surgical History:  Procedure Laterality Date   ABDOMINAL HYSTERECTOMY     BILATERAL SALPINGECTOMY Bilateral 02/09/2018   Procedure: BILATERAL SALPINGECTOMY;  Surgeon: Lazaro Arms, MD;  Location: [facility];  Service: Gynecology;  Laterality: Bilateral;   ESOPHAGOGASTRODUODENOSCOPY (EGD) WITH PROPOFOL N/A 10/03/2018   Procedure: ESOPHAGOGASTRODUODENOSCOPY (EGD) WITH PROPOFOL;  Surgeon: Corbin Ade, MD;  Location: [facility];  Service: Endoscopy;  Laterality: N/A;  1:45pm   MALONEY DILATION N/A 10/03/2018   Procedure: Elease Hashimoto DILATION;  Surgeon: Corbin Ade, MD;  Location: [facility];  Service: Endoscopy;  Laterality: N/A;   SUPRACERVICAL ABDOMINAL HYSTERECTOMY N/A  02/09/2018   Procedure: HYSTERECTOMY SUPRACERVICAL ABDOMINAL;  Surgeon: Lazaro Arms, MD;  Location: [facility];  Service: Gynecology;  Laterality: N/A;   TUBAL LIGATION     tubes tied      OB History     Gravida  5   Para  3   Term      Preterm      AB  2   Living  3      SAB      IAB  2   Ectopic      Multiple      Live Births               Home Medications    Prior to Admission medications   Medication Sig Start Date End Date Taking? Authorizing Provider  fluticasone (FLONASE) 50 MCG/ACT nasal spray Place 1 spray into both nostrils 2 (two) times daily. 08/27/23  Yes Particia Nearing, PA-C  promethazine-dextromethorphan (PROMETHAZINE-DM) 6.25-15 MG/5ML syrup Take 5 mLs by mouth 4 (four) times daily as needed. 08/27/23  Yes Particia Nearing, PA-C  albuterol (PROVENTIL) (2.5 MG/3ML) 0.083% nebulizer solution Take 3 mLs (2.5 mg total) by nebulization every 6 (six) hours as needed for wheezing or shortness of breath. 01/08/21   Moshe Cipro, FNP  cetirizine (ZYRTEC) 10 MG tablet Take 1 tablet (10 mg total) by mouth daily. Patient taking differently: Take 10 mg by mouth as needed. 02/25/20   Wurst, [place], PA-C  Cyanocobalamin (VITAMIN B-12 PO) Take by mouth.    [provider]  fluticasone (FLONASE) 50 MCG/ACT nasal spray  Place 1 spray into both nostrils 2 (two) times daily. 08/10/22   Particia Nearing, PA-C  Multiple Vitamin (MULTIVITAMIN) tablet Take 1 tablet by mouth daily. Gummy    [provider]  Multiple Vitamins-Minerals (HAIR SKIN AND NAILS FORMULA PO) Take 2 tablets by mouth daily. Gummies    [provider]  sertraline (ZOLOFT) 50 MG tablet Take 50 mg by mouth daily. 10/02/21   [provider]  VENTOLIN HFA 108 (90 Base) MCG/ACT inhaler Inhale 2 puffs into the lungs every 4 (four) hours as needed for wheezing or shortness of breath. 01/08/21   Moshe Cipro, FNP    Family History Family History   Problem Relation Age of Onset   Heart attack Paternal Grandfather    Diabetes Paternal Grandmother    Hypertension Mother    Colon cancer Neg Hx    Gastric cancer Neg Hx    Esophageal cancer Neg Hx     Social History Social History   Tobacco Use   Smoking status: Never   Smokeless tobacco: Never  Vaping Use   Vaping status: Never Used  Substance Use Topics   Alcohol use: Yes    Comment: occasionally   Drug use: No     Allergies   Penicillins   Review of Systems Review of Systems Per HPI  Physical Exam Triage Vital Signs ED Triage Vitals  Encounter Vitals Group     BP 08/27/23 0840 112/75     Systolic BP Percentile --      Diastolic BP Percentile --      Pulse Rate 08/27/23 0840 77     Resp 08/27/23 0840 20     Temp 08/27/23 0840 98.1 F (36.7 C)     Temp Source 08/27/23 0840 Oral     SpO2 08/27/23 0840 96 %     Weight --      Height --      Head Circumference --      Peak Flow --      Pain Score 08/27/23 0842 0     Pain Loc --      Pain Education --      Exclude from Growth Chart --    No data found.  Updated Vital Signs BP 112/75 (BP Location: Right Arm)   Pulse 77   Temp 98.1 F (36.7 C) (Oral)   Resp 20   LMP 01/15/2018 Comment: SCH  SpO2 96%   Visual Acuity Right Eye Distance:   Left Eye Distance:   Bilateral Distance:    Right Eye Near:   Left Eye Near:    Bilateral Near:     Physical Exam Vitals and nursing note reviewed.  Constitutional:      Appearance: Normal appearance.  HENT:     Head: Atraumatic.     Right Ear: Tympanic membrane and external ear normal.     Left Ear: Tympanic membrane and external ear normal.     Nose: Rhinorrhea present.     Mouth/Throat:     Mouth: Mucous membranes are moist.     Pharynx: Posterior oropharyngeal erythema present.  Eyes:     Extraocular Movements: Extraocular movements intact.     Conjunctiva/sclera: Conjunctivae normal.  Cardiovascular:     Rate and Rhythm: Normal rate and  regular rhythm.     Heart sounds: Normal heart sounds.  Pulmonary:     Effort: Pulmonary effort is normal.     Breath sounds: Wheezing present. No rales.  Musculoskeletal:  General: Normal range of motion.     Cervical back: Normal range of motion and neck supple.  Skin:    General: Skin is warm and dry.  Neurological:     Mental Status: She is alert and oriented to person, place, and time.  Psychiatric:        Mood and Affect: Mood normal.        Thought Content: Thought content normal.      UC Treatments / Results  Labs (all labs ordered are listed, but only abnormal results are displayed) Labs Reviewed - No data to display  EKG   Radiology No results found.  Procedures Procedures (including critical care time)  Medications Ordered in UC Medications  dexamethasone (DECADRON) injection 10 mg (10 mg Intramuscular Given 08/27/23 0903)    Initial Impression / Assessment and Plan / UC Course  I have reviewed the triage vital signs and the nursing notes.  Pertinent labs & imaging results that were available during my care of the patient were reviewed by me and considered in my medical decision making (see chart for details).     Vitals and exam very reassuring today, she is well-appearing and in no acute distress.  Suspect viral respiratory infection causing asthma exacerbation.  Treat with IM Decadron, Phenergan DM, Flonase, supportive over-the-counter medications and home care.  Return for worsening symptoms.  Final Clinical Impressions(s) / UC Diagnoses   Final diagnoses:  Viral URI with cough  Mild intermittent asthma with acute exacerbation   Discharge Instructions   None    ED Prescriptions     Medication Sig Dispense Auth. Provider   promethazine-dextromethorphan (PROMETHAZINE-DM) 6.25-15 MG/5ML syrup Take 5 mLs by mouth 4 (four) times daily as needed. 100 mL Particia Nearing, PA-C   fluticasone [facility]) 50 MCG/ACT nasal spray Place 1 spray  into both nostrils 2 (two) times daily. 16 g Particia Nearing, [place]      PDMP not reviewed this encounter.   Roosvelt Maser Cr[place], [place] 08/27/23 [phone]

## 2023-08-27 NOTE — ED Triage Notes (Signed)
Pt reports sh has some coughing, left ear pain, and wheezing x 3 days

## 2023-11-24 ENCOUNTER — Other Ambulatory Visit (HOSPITAL_COMMUNITY): Payer: Self-pay | Admitting: Adult Health

## 2023-11-24 DIAGNOSIS — Z1231 Encounter for screening mammogram for malignant neoplasm of breast: Secondary | ICD-10-CM

## 2023-12-02 ENCOUNTER — Other Ambulatory Visit (HOSPITAL_COMMUNITY): Payer: Self-pay | Admitting: Adult Health

## 2023-12-02 DIAGNOSIS — Z1231 Encounter for screening mammogram for malignant neoplasm of breast: Secondary | ICD-10-CM

## 2023-12-03 ENCOUNTER — Inpatient Hospital Stay (HOSPITAL_COMMUNITY): Admission: RE | Admit: 2023-12-03 | Source: Ambulatory Visit

## 2023-12-03 DIAGNOSIS — Z1231 Encounter for screening mammogram for malignant neoplasm of breast: Secondary | ICD-10-CM

## 2023-12-13 ENCOUNTER — Encounter (HOSPITAL_COMMUNITY): Payer: Self-pay

## 2023-12-20 ENCOUNTER — Ambulatory Visit (HOSPITAL_COMMUNITY)

## 2024-01-03 ENCOUNTER — Other Ambulatory Visit (HOSPITAL_COMMUNITY): Payer: Self-pay | Admitting: Adult Health

## 2024-01-03 ENCOUNTER — Encounter (HOSPITAL_COMMUNITY): Payer: Self-pay

## 2024-01-03 ENCOUNTER — Ambulatory Visit (HOSPITAL_COMMUNITY)
Admission: RE | Admit: 2024-01-03 | Discharge: 2024-01-03 | Disposition: A | Discharge status: Home / Self Care | Source: Ambulatory Visit | Attending: Adult Health | Admitting: Adult Health

## 2024-01-03 DIAGNOSIS — Z1231 Encounter for screening mammogram for malignant neoplasm of breast: Secondary | ICD-10-CM | POA: Insufficient documentation

## 2024-01-03 DIAGNOSIS — N63 Unspecified lump in unspecified breast: Secondary | ICD-10-CM

## 2024-01-20 ENCOUNTER — Ambulatory Visit (HOSPITAL_COMMUNITY)
Admission: RE | Admit: 2024-01-20 | Discharge: 2024-01-20 | Disposition: A | Discharge status: Home / Self Care | Source: Ambulatory Visit | Attending: Adult Health | Admitting: Adult Health

## 2024-01-20 ENCOUNTER — Ambulatory Visit: Payer: Self-pay | Admitting: Adult Health

## 2024-01-20 ENCOUNTER — Encounter (HOSPITAL_COMMUNITY): Payer: Self-pay

## 2024-01-20 DIAGNOSIS — R92323 Mammographic fibroglandular density, bilateral breasts: Secondary | ICD-10-CM | POA: Diagnosis not present

## 2024-01-20 DIAGNOSIS — N63 Unspecified lump in unspecified breast: Secondary | ICD-10-CM

## 2024-03-22 DIAGNOSIS — J302 Other seasonal allergic rhinitis: Secondary | ICD-10-CM | POA: Diagnosis not present

## 2024-03-22 DIAGNOSIS — Z299 Encounter for prophylactic measures, unspecified: Secondary | ICD-10-CM | POA: Diagnosis not present

## 2024-03-22 DIAGNOSIS — F419 Anxiety disorder, unspecified: Secondary | ICD-10-CM | POA: Diagnosis not present

## 2024-03-22 DIAGNOSIS — R0683 Snoring: Secondary | ICD-10-CM | POA: Diagnosis not present

## 2024-03-22 DIAGNOSIS — R109 Unspecified abdominal pain: Secondary | ICD-10-CM | POA: Diagnosis not present

## 2024-03-22 DIAGNOSIS — R635 Abnormal weight gain: Secondary | ICD-10-CM | POA: Diagnosis not present

## 2024-03-22 DIAGNOSIS — K219 Gastro-esophageal reflux disease without esophagitis: Secondary | ICD-10-CM | POA: Diagnosis not present

## 2024-04-20 ENCOUNTER — Ambulatory Visit: Payer: Self-pay | Admitting: Family Medicine

## 2024-05-04 DIAGNOSIS — Z2821 Immunization not carried out because of patient refusal: Secondary | ICD-10-CM | POA: Diagnosis not present

## 2024-05-04 DIAGNOSIS — Z6838 Body mass index (BMI) 38.0-38.9, adult: Secondary | ICD-10-CM | POA: Diagnosis not present

## 2024-05-04 DIAGNOSIS — G4733 Obstructive sleep apnea (adult) (pediatric): Secondary | ICD-10-CM | POA: Diagnosis not present

## 2024-05-04 DIAGNOSIS — Z299 Encounter for prophylactic measures, unspecified: Secondary | ICD-10-CM | POA: Diagnosis not present

## 2024-05-04 DIAGNOSIS — R0981 Nasal congestion: Secondary | ICD-10-CM | POA: Diagnosis not present

## 2024-06-15 DIAGNOSIS — Z0000 Encounter for general adult medical examination without abnormal findings: Secondary | ICD-10-CM | POA: Diagnosis not present

## 2024-06-15 DIAGNOSIS — Z299 Encounter for prophylactic measures, unspecified: Secondary | ICD-10-CM | POA: Diagnosis not present

## 2024-06-15 DIAGNOSIS — Z1331 Encounter for screening for depression: Secondary | ICD-10-CM | POA: Diagnosis not present

## 2024-06-15 DIAGNOSIS — G4733 Obstructive sleep apnea (adult) (pediatric): Secondary | ICD-10-CM | POA: Diagnosis not present

## 2024-06-15 DIAGNOSIS — Z79899 Other long term (current) drug therapy: Secondary | ICD-10-CM | POA: Diagnosis not present

## 2024-06-15 DIAGNOSIS — R5383 Other fatigue: Secondary | ICD-10-CM | POA: Diagnosis not present

## 2024-06-20 ENCOUNTER — Encounter (INDEPENDENT_AMBULATORY_CARE_PROVIDER_SITE_OTHER): Payer: Self-pay | Admitting: *Deleted

## 2024-06-27 DIAGNOSIS — G4733 Obstructive sleep apnea (adult) (pediatric): Secondary | ICD-10-CM | POA: Diagnosis not present

## 2024-06-27 DIAGNOSIS — G4700 Insomnia, unspecified: Secondary | ICD-10-CM | POA: Diagnosis not present
